# Patient Record
Sex: Female | Born: 1955 | Race: White | Hispanic: No | Marital: Married | Smoking: Never smoker
Health system: Southern US, Community
[De-identification: ages and names within clinical notes are randomized; demographics above are authoritative.]

## PROBLEM LIST (undated history)

## (undated) DIAGNOSIS — C4492 Squamous cell carcinoma of skin, unspecified: Secondary | ICD-10-CM

## (undated) DIAGNOSIS — N951 Menopausal and female climacteric states: Secondary | ICD-10-CM

## (undated) DIAGNOSIS — E785 Hyperlipidemia, unspecified: Secondary | ICD-10-CM

## (undated) DIAGNOSIS — Z8489 Family history of other specified conditions: Secondary | ICD-10-CM

## (undated) HISTORY — PX: LAPAROSCOPIC ABDOMINAL EXPLORATION: SHX6249

## (undated) HISTORY — PX: TUBAL LIGATION: SHX77

## (undated) HISTORY — PX: TONSILLECTOMY: SUR1361

## (undated) HISTORY — PX: COLONOSCOPY: SHX174

## (undated) HISTORY — DX: Squamous cell carcinoma of skin, unspecified: C44.92

---

## 2005-03-06 ENCOUNTER — Ambulatory Visit: Payer: Self-pay | Admitting: Family Medicine

## 2007-12-31 ENCOUNTER — Ambulatory Visit (HOSPITAL_COMMUNITY): Admission: RE | Admit: 2007-12-31 | Discharge: 2007-12-31 | Payer: Self-pay | Admitting: Cardiovascular Disease

## 2010-08-17 HISTORY — PX: OTHER SURGICAL HISTORY: SHX169

## 2013-04-01 ENCOUNTER — Emergency Department (HOSPITAL_COMMUNITY)
Admission: EM | Admit: 2013-04-01 | Discharge: 2013-04-01 | Disposition: A | Discharge status: Home / Self Care | Payer: 59 | Attending: Emergency Medicine | Admitting: Emergency Medicine

## 2013-04-01 ENCOUNTER — Encounter (HOSPITAL_COMMUNITY): Payer: Self-pay | Admitting: Emergency Medicine

## 2013-04-01 ENCOUNTER — Emergency Department (HOSPITAL_COMMUNITY): Payer: 59

## 2013-04-01 DIAGNOSIS — E785 Hyperlipidemia, unspecified: Secondary | ICD-10-CM | POA: Insufficient documentation

## 2013-04-01 DIAGNOSIS — Y9389 Activity, other specified: Secondary | ICD-10-CM | POA: Insufficient documentation

## 2013-04-01 DIAGNOSIS — S52539A Colles' fracture of unspecified radius, initial encounter for closed fracture: Secondary | ICD-10-CM | POA: Insufficient documentation

## 2013-04-01 DIAGNOSIS — Z791 Long term (current) use of non-steroidal anti-inflammatories (NSAID): Secondary | ICD-10-CM | POA: Insufficient documentation

## 2013-04-01 DIAGNOSIS — Z79899 Other long term (current) drug therapy: Secondary | ICD-10-CM | POA: Insufficient documentation

## 2013-04-01 DIAGNOSIS — Z880 Allergy status to penicillin: Secondary | ICD-10-CM | POA: Insufficient documentation

## 2013-04-01 DIAGNOSIS — Z8742 Personal history of other diseases of the female genital tract: Secondary | ICD-10-CM | POA: Insufficient documentation

## 2013-04-01 DIAGNOSIS — Y92410 Unspecified street and highway as the place of occurrence of the external cause: Secondary | ICD-10-CM | POA: Insufficient documentation

## 2013-04-01 HISTORY — DX: Hyperlipidemia, unspecified: E78.5

## 2013-04-01 HISTORY — DX: Menopausal and female climacteric states: N95.1

## 2013-04-01 MED ORDER — METHOCARBAMOL 500 MG PO TABS: 500.0000 mg | ORAL_TABLET | Freq: Four times a day (QID) | ORAL | Status: DC

## 2013-04-01 MED ORDER — SODIUM CHLORIDE 0.9 % IV BOLUS (SEPSIS)
1000.0000 mL | Freq: Once | INTRAVENOUS | Status: AC
  Administered 2013-04-01: 1000 mL via INTRAVENOUS

## 2013-04-01 MED ORDER — OXYCODONE HCL 5 MG PO CAPS: 5.0000 mg | ORAL_CAPSULE | ORAL | Status: DC | PRN

## 2013-04-01 MED ORDER — MORPHINE SULFATE 2 MG/ML IJ SOLN
2.0000 mg | Freq: Once | INTRAMUSCULAR | Status: AC
  Administered 2013-04-01: 2 mg via INTRAVENOUS
  Filled 2013-04-01: qty 1

## 2013-04-01 MED ORDER — OXYCODONE HCL 5 MG PO CAPS: 10.0000 mg | ORAL_CAPSULE | ORAL | Status: DC | PRN

## 2013-04-01 MED ORDER — ONDANSETRON HCL 4 MG/2ML IJ SOLN
4.0000 mg | Freq: Once | INTRAMUSCULAR | Status: AC
  Administered 2013-04-01: 4 mg via INTRAVENOUS
  Filled 2013-04-01: qty 2
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
## 2013-04-01 NOTE — ED Notes (Signed)
Patient transported to X-ray 
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
## 2013-04-01 NOTE — Discharge Instructions (Signed)
Please elevate and keep your upper extremity clean and dry. Please move your fingers frequently and ice the upper extremity. Please call for any problems  If you develop worsening pain in her chest please followup in the emergency room where regular physician.  [name]..Keep bandage clean and dry.  Call for any problems.  No smoking.  Criteria for driving a car: you should be off your pain medicine for 7-8 hours, able to drive one handed(confident), thinking clearly and feeling able in your judgement to drive. Continue elevation as it will decrease swelling.  If instructed by MD move your fingers within the confines of the bandage/splint.  Use ice if instructed by your MD. Call immediately for any sudden loss of feeling in your hand/arm or change in functional abilities of the extremity.  [name]..We recommend that you to take vitamin C 1000 mg a day to promote healing we also recommend that if you require her pain medicine that he take a stool softener to prevent constipation as most pain medicines will have constipation side effects. We recommend either Peri-Colace or Senokot and recommend that you also consider adding MiraLAX to prevent the constipation affects from pain medicine if you are required to use them. These medicines are over the counter and maybe purchased at a local pharmacy.
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
## 2013-04-02 ENCOUNTER — Encounter (HOSPITAL_COMMUNITY): Payer: Self-pay | Admitting: Pharmacy Technician

## 2013-04-02 ENCOUNTER — Other Ambulatory Visit: Payer: Self-pay | Admitting: Orthopedic Surgery

## 2013-04-03 ENCOUNTER — Encounter (HOSPITAL_COMMUNITY): Payer: Self-pay | Admitting: *Deleted

## 2013-04-03 NOTE — ED Provider Notes (Signed)
Medical screening examination/treatment/procedure(s) were performed by non-physician practitioner and as supervising physician I was immediately available for consultation/collaboration.  EKG Interpretation   None         Lauralee Waters T Aleza Pew, MD 04/03/13 0742 

## 2013-04-04 MED ORDER — VANCOMYCIN HCL IN DEXTROSE 1-5 GM/200ML-% IV SOLN
1000.0000 mg | INTRAVENOUS | Status: AC
  Administered 2013-04-05: 1000 mg via INTRAVENOUS
  Filled 2013-04-04: qty 200

## 2013-04-05 ENCOUNTER — Ambulatory Visit (HOSPITAL_COMMUNITY)
Admission: RE | Admit: 2013-04-05 | Discharge: 2013-04-05 | Disposition: A | Discharge status: Home / Self Care | Payer: 59 | Source: Ambulatory Visit | Attending: Orthopedic Surgery | Admitting: Orthopedic Surgery

## 2013-04-05 ENCOUNTER — Encounter (HOSPITAL_COMMUNITY): Payer: 59 | Admitting: Anesthesiology

## 2013-04-05 ENCOUNTER — Ambulatory Visit (HOSPITAL_COMMUNITY): Payer: 59 | Admitting: Anesthesiology

## 2013-04-05 ENCOUNTER — Encounter (HOSPITAL_COMMUNITY): Payer: Self-pay | Admitting: *Deleted

## 2013-04-05 ENCOUNTER — Encounter (HOSPITAL_COMMUNITY)
Admission: RE | Disposition: A | Discharge status: Home / Self Care | Payer: Self-pay | Source: Ambulatory Visit | Attending: Orthopedic Surgery

## 2013-04-05 DIAGNOSIS — X58XXXA Exposure to other specified factors, initial encounter: Secondary | ICD-10-CM | POA: Insufficient documentation

## 2013-04-05 DIAGNOSIS — E785 Hyperlipidemia, unspecified: Secondary | ICD-10-CM | POA: Insufficient documentation

## 2013-04-05 DIAGNOSIS — Z79818 Long term (current) use of other agents affecting estrogen receptors and estrogen levels: Secondary | ICD-10-CM | POA: Insufficient documentation

## 2013-04-05 DIAGNOSIS — Z780 Asymptomatic menopausal state: Secondary | ICD-10-CM | POA: Insufficient documentation

## 2013-04-05 DIAGNOSIS — S52599A Other fractures of lower end of unspecified radius, initial encounter for closed fracture: Secondary | ICD-10-CM | POA: Insufficient documentation

## 2013-04-05 DIAGNOSIS — Z880 Allergy status to penicillin: Secondary | ICD-10-CM | POA: Insufficient documentation

## 2013-04-05 DIAGNOSIS — S5292XA Unspecified fracture of left forearm, initial encounter for closed fracture: Secondary | ICD-10-CM

## 2013-04-05 DIAGNOSIS — Z882 Allergy status to sulfonamides status: Secondary | ICD-10-CM | POA: Insufficient documentation

## 2013-04-05 HISTORY — PX: ORIF RADIAL FRACTURE: SHX5113

## 2013-04-05 HISTORY — DX: Family history of other specified conditions: Z84.89

## 2013-04-05 SURGERY — OPEN REDUCTION INTERNAL FIXATION (ORIF) RADIAL FRACTURE
Anesthesia: General | Site: Arm Upper | Laterality: Left

## 2013-04-05 MED ORDER — LACTATED RINGERS IV SOLN
INTRAVENOUS | Status: DC
  Administered 2013-04-05: 07:00:00 via INTRAVENOUS

## 2013-04-05 MED ORDER — CHLORHEXIDINE GLUCONATE 4 % EX LIQD
60.0000 mL | Freq: Once | CUTANEOUS | Status: DC
  Filled 2013-04-05: qty 60

## 2013-04-05 MED ORDER — MIDAZOLAM HCL 2 MG/2ML IJ SOLN
INTRAMUSCULAR | Status: AC
  Filled 2013-04-05: qty 2

## 2013-04-05 MED ORDER — BUPIVACAINE HCL (PF) 0.25 % IJ SOLN
INTRAMUSCULAR | Status: AC
  Filled 2013-04-05: qty 30

## 2013-04-05 MED ORDER — FENTANYL CITRATE 0.05 MG/ML IJ SOLN
INTRAMUSCULAR | Status: AC
  Filled 2013-04-05: qty 5

## 2013-04-05 MED ORDER — FENTANYL CITRATE 0.05 MG/ML IJ SOLN
INTRAMUSCULAR | Status: DC | PRN
  Administered 2013-04-05: 100 ug via INTRAVENOUS
  Administered 2013-04-05: 50 ug via INTRAVENOUS

## 2013-04-05 MED ORDER — SODIUM CHLORIDE 0.45 % IV SOLN: INTRAVENOUS | Status: DC

## 2013-04-05 MED ORDER — MIDAZOLAM HCL 5 MG/5ML IJ SOLN
INTRAMUSCULAR | Status: DC | PRN
  Administered 2013-04-05 (×2): 1 mg via INTRAVENOUS
  Administered 2013-04-05: 2 mg via INTRAVENOUS

## 2013-04-05 MED ORDER — DOXYCYCLINE HYCLATE 50 MG PO CAPS: 50.0000 mg | ORAL_CAPSULE | Freq: Two times a day (BID) | ORAL | Status: DC

## 2013-04-05 MED ORDER — LACTATED RINGERS IV SOLN
INTRAVENOUS | Status: DC | PRN
  Administered 2013-04-05: 08:00:00 via INTRAVENOUS

## 2013-04-05 MED ORDER — 0.9 % SODIUM CHLORIDE (POUR BTL) OPTIME
TOPICAL | Status: DC | PRN
  Administered 2013-04-05: 1000 mL

## 2013-04-05 MED ORDER — FENTANYL CITRATE 0.05 MG/ML IJ SOLN
INTRAMUSCULAR | Status: AC
  Filled 2013-04-05: qty 2

## 2013-04-05 MED ORDER — ONDANSETRON HCL 4 MG/2ML IJ SOLN
INTRAMUSCULAR | Status: DC | PRN
  Administered 2013-04-05: 4 mg via INTRAVENOUS

## 2013-04-05 SURGICAL SUPPLY — 50 items
BANDAGE ELASTIC 3 VELCRO ST LF (GAUZE/BANDAGES/DRESSINGS) ×3 IMPLANT
BANDAGE ELASTIC 4 VELCRO ST LF (GAUZE/BANDAGES/DRESSINGS) ×3 IMPLANT
BANDAGE GAUZE ELAST BULKY 4 IN (GAUZE/BANDAGES/DRESSINGS) ×3 IMPLANT
BLADE SURG ROTATE 9660 (MISCELLANEOUS) IMPLANT
BNDG ESMARK 4X9 LF (GAUZE/BANDAGES/DRESSINGS) ×3 IMPLANT
CORDS BIPOLAR (ELECTRODE) ×3 IMPLANT
COVER SURGICAL LIGHT HANDLE (MISCELLANEOUS) ×3 IMPLANT
CUFF TOURNIQUET SINGLE 18IN (TOURNIQUET CUFF) ×3 IMPLANT
CUFF TOURNIQUET SINGLE 24IN (TOURNIQUET CUFF) IMPLANT
DECANTER SPIKE VIAL GLASS SM (MISCELLANEOUS) IMPLANT
DRAIN TLS ROUND 10FR (DRAIN) IMPLANT
DRAPE OEC MINIVIEW 54X84 (DRAPES) ×3 IMPLANT
DRAPE U-SHAPE 47X51 STRL (DRAPES) ×3 IMPLANT
DRSG EMULSION OIL 3X3 NADH (GAUZE/BANDAGES/DRESSINGS) ×3 IMPLANT
GAUZE XEROFORM 1X8 LF (GAUZE/BANDAGES/DRESSINGS) ×3 IMPLANT
GLOVE ORTHO TXT STRL SZ7.5 (GLOVE) ×3 IMPLANT
GLOVE SS BIOGEL STRL SZ 8 (GLOVE) ×1 IMPLANT
GLOVE SUPERSENSE BIOGEL SZ 8 (GLOVE) ×2
GOWN SRG XL XLNG 56XLVL 4 (GOWN DISPOSABLE) ×3 IMPLANT
GOWN STRL NON-REIN LRG LVL3 (GOWN DISPOSABLE) ×9 IMPLANT
GOWN STRL NON-REIN XL XLG LVL4 (GOWN DISPOSABLE) ×6
KIT BASIN OR (CUSTOM PROCEDURE TRAY) ×3 IMPLANT
KIT ROOM TURNOVER OR (KITS) ×3 IMPLANT
LOOP VESSEL MAXI BLUE (MISCELLANEOUS) IMPLANT
MANIFOLD NEPTUNE II (INSTRUMENTS) IMPLANT
NEEDLE 22X1 1/2 (OR ONLY) (NEEDLE) ×3 IMPLANT
NS IRRIG 1000ML POUR BTL (IV SOLUTION) ×3 IMPLANT
PACK ORTHO EXTREMITY (CUSTOM PROCEDURE TRAY) ×3 IMPLANT
PAD ARMBOARD 7.5X6 YLW CONV (MISCELLANEOUS) ×6 IMPLANT
PAD CAST 4YDX4 CTTN HI CHSV (CAST SUPPLIES) ×1 IMPLANT
PADDING CAST COTTON 4X4 STRL (CAST SUPPLIES) ×2
PLATE CROSSLOCK DVR EPACK (Plate) ×3 IMPLANT
SCREW LOCKING 2.7MM 12MM STE (Screw) ×3 IMPLANT
SPLINT FIBERGLASS 3X35 (CAST SUPPLIES) ×3 IMPLANT
SPONGE GAUZE 4X4 12PLY (GAUZE/BANDAGES/DRESSINGS) ×3 IMPLANT
SPONGE GAUZE 4X4 12PLY STER LF (GAUZE/BANDAGES/DRESSINGS) ×3 IMPLANT
SPONGE LAP 4X18 X RAY DECT (DISPOSABLE) IMPLANT
SUT MNCRL AB 4-0 PS2 18 (SUTURE) ×3 IMPLANT
SUT PROLENE 3 0 PS 2 (SUTURE) IMPLANT
SUT PROLENE 4 0 PS 2 18 (SUTURE) ×6 IMPLANT
SUT VIC AB 3-0 FS2 27 (SUTURE) ×6 IMPLANT
SYR CONTROL 10ML LL (SYRINGE) ×3 IMPLANT
SYSTEM CHEST DRAIN TLS 7FR (DRAIN) IMPLANT
TOWEL OR 17X24 6PK STRL BLUE (TOWEL DISPOSABLE) ×3 IMPLANT
TOWEL OR 17X26 10 PK STRL BLUE (TOWEL DISPOSABLE) ×3 IMPLANT
TUBE CONNECTING 12'X1/4 (SUCTIONS) ×1
TUBE CONNECTING 12X1/4 (SUCTIONS) ×2 IMPLANT
TUBE EVACUATION TLS (MISCELLANEOUS) ×3 IMPLANT
UNDERPAD 30X30 INCONTINENT (UNDERPADS AND DIAPERS) ×3 IMPLANT
WATER STERILE IRR 1000ML POUR (IV SOLUTION) IMPLANT

## 2013-04-05 NOTE — Anesthesia Preprocedure Evaluation (Addendum)
Anesthesia Evaluation  Patient identified by MRN, date of birth, ID band Patient awake    Reviewed: Allergy & Precautions, H&P , NPO status , Patient's Chart, lab work & pertinent test results  Airway Mallampati: II TM Distance: >3 FB     Dental  (+) Teeth Intact   Pulmonary    Pulmonary exam normal       Cardiovascular Rhythm:Regular Rate:Normal     Neuro/Psych    GI/Hepatic   Endo/Other    Renal/GU      Musculoskeletal   Abdominal Normal abdominal exam  (+)   Peds  Hematology   Anesthesia Other Findings   Reproductive/Obstetrics                          Anesthesia Physical Anesthesia Plan  ASA: I  Anesthesia Plan: MAC and Regional   Post-op Pain Management: MAC Combined w/ Regional for Post-op pain   Induction:   Airway Management Planned: Nasal Cannula  Additional Equipment:   Intra-op Plan:   Post-operative Plan:   Informed Consent:   Plan Discussed with:   Anesthesia Plan Comments:         Anesthesia Quick Evaluation

## 2013-04-05 NOTE — Transfer of Care (Signed)
Immediate Anesthesia Transfer of Care Note  Patient: Jennifer Pitts  Procedure(s) Performed: Procedure(s): OPEN REDUCTION INTERNAL FIXATION (ORIF) LEFT RADIUS FRACTURE WITH REPAIR RECONSTRUCTION AS NECESSARY  (Left)  Patient Location: PACU  Anesthesia Type:MAC combined with regional for post-op pain  Level of Consciousness: awake, alert  and oriented  Airway & Oxygen Therapy: Patient Spontanous Breathing and Patient connected to nasal cannula oxygen  Post-op Assessment: Report given to PACU RN and Post -op Vital signs reviewed and stable  Post vital signs: Reviewed and stable  Complications: No apparent anesthesia complications

## 2013-04-05 NOTE — Op Note (Signed)
NAMErskine Squibb:  Jennifer Pitts, Jennifer Pitts             ACCOUNT NO.:  [id number]  MEDICAL RECORD NO.:  [id number]  LOCATION:  [facility]                         FACILITY:  [facility]  PHYSICIAN:  Dionne AnoWilliam M. Avry Roedl, M.D.DATE OF BIRTH:  [birth year]  DATE OF PROCEDURE:  04/05/2013 DATE OF DISCHARGE:                              OPERATIVE REPORT   PREOPERATIVE DIAGNOSIS:  Comminuted complex greater than 3-part intra- articular distal radius fracture, left upper extremity.  POSTOPERATIVE DIAGNOSIS:  Comminuted complex greater than 3-part intra- articular distal radius fracture, left upper extremity.  PROCEDURE: 1. Left wrist open reduction and internal fixation with [employer] plate     and screw construct. 2. Stress radiography. 3. Sliding brachioradialis tenotomy.  SURGEON:  Dionne AnoWilliam M. Amanda PeaGramig, M.D.  ASSISTANT:  None.  COMPLICATIONS:  None.  ANESTHESIA:  Block anesthetic with IV sedation.  TOURNIQUET TIME:  Less than an hour.  ESTIMATED BLOOD LOSS:  Minimal.  INDICATION:  This pleasant female presents with comminuted complex greater than 3-part intra-articular distal radius fracture.  She understands risks and benefits of surgery and desires to proceed with the above-mentioned operative intervention.  I have discussed with her all issues, do's and don'ts, and all questions have been encouraged and answered.  With this in mind, we will proceed accordingly.  Our goal was to obtain adequate geometry and reconciliation of her anatomy so as to provide her the most optimal function for the future.  OPERATION IN DETAIL:  The patient seen by myself and anesthesia.  Dr. Kipp Broodavid Joslin performed anesthetic services for block anesthetic, this worked wonderfully.  She had no pain, was taken to the operative arena. Arm had been marked.  Time-out was called.  I prepped and draped her myself with Betadine scrub and paint.  Following this, secured a sterile field.  Arm was elevated, tourniquet was insufflated to 250 mmHg  and a volar radial incision was made after time-out was called.  The volar radial incision allowed adequate access to the FCR tendon, which was split.  Compartment volarly was split nicely without complicating features.  The carpal canal contents were retracted ulnarly.  Pronator incised in a L-shape fashion, and the fracture was accessed.  Following this, I then performed fracture site reduction manually and then applied a narrow DVR plate from [employer].  This was in the form of a plate and screw construct allowing combination smooth pegs and screws for fixation.  The patient tolerated this quite well without difficulty and all screw lengths looked wonderful at the conclusion of the case.  I was able to achieve adequate radial height, volar tilt, and radial inclination.  A sliding brachioradialis tenotomy was accomplished without difficulty to lessen deforming forces off of the styloid.  Following this, stress radiography revealed excellent position.  Her ulnar styloid was stable.  TFC appeared to be stable.  Intercarpal arrangements stable.  I did not see any obvious interosseous ligament injury.  Live fluoro was accomplished to verify this.  Following this, the patient had copious irrigation applied followed by closure of the pronator with 3-0 Vicryl followed by closure of the skin edge with Prolene at the conclusion of the case with hemostasis secured.  The patient tolerated this well.  There  were no complicating features.  The patient had a bandage of Adaptic, Xeroform, gauze, and a clamshell splint applied without difficulty.  She was taken to the recovery room in stable condition after waking.  We will be monitoring her in the recovery room and then discharge her home.  We will plan to discharge her home with doxycycline for antibiotic prophylaxis as well as her regular pain medicine, which she has and regular medicines in general. We will see her in 12 days.  We will cast her at  4 weeks, begin interval range of motion.  Between 4 and 8 weeks, motion will be the rule after 8 weeks strengthening.  These notes have been discussed and all questions have been encouraged and answered.  It was an absolute pleasure to see and treat her today.     Dionne Ano. Amanda Pea, M.D.     [facility]  D:  04/05/2013  T:  04/05/2013  Job:  [id number]

## 2013-04-05 NOTE — Op Note (Signed)
See Dictation #[id number] Amanda PeaGramig MD
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
## 2013-04-05 NOTE — Discharge Instructions (Signed)

## 2013-04-05 NOTE — Anesthesia Postprocedure Evaluation (Signed)
  Anesthesia Post-op Note  Patient: Jennifer Pitts  Procedure(s) Performed: Procedure(s): OPEN REDUCTION INTERNAL FIXATION (ORIF) LEFT RADIUS FRACTURE WITH REPAIR RECONSTRUCTION AS NECESSARY  (Left)  Patient Location: PACU  Anesthesia Type:MAC combined with regional for post-op pain  Level of Consciousness: awake, alert  and oriented  Airway and Oxygen Therapy: Patient Spontanous Breathing  Post-op Pain: none  Post-op Assessment: Post-op Vital signs reviewed, Patient's Cardiovascular Status Stable, Respiratory Function Stable, Patent Airway and Pain level controlled  Post-op Vital Signs: stable  Complications: No apparent anesthesia complications

## 2013-04-05 NOTE — Anesthesia Procedure Notes (Signed)
Anesthesia Regional Block:  Supraclavicular block  Pre-Anesthetic Checklist: ,, timeout performed, Correct Patient, Correct Site, Correct Laterality, Correct Procedure, Correct Position, site marked, Risks and benefits discussed,  Surgical consent,  Pre-op evaluation,  At surgeon's request and post-op pain management  Laterality: Left  Prep: chloraprep       Needles:  Injection technique: Single-shot  Needle Type: Echogenic Stimulator Needle     Needle Length: 9cm 9 cm Needle Gauge: 22 and 22 G    Additional Needles:  Procedures: ultrasound guided (picture in chart) Supraclavicular block Narrative:  Start time: 04/05/2013 10:00 AM End time: 04/05/2013 10:05 AM Injection made incrementally with aspirations every 5 mL.  Performed by: Personally   Additional Notes: 25 cc 0.5% marcaine with 1:200 epi and 8 mg decadron injected easily

## 2013-04-05 NOTE — Preoperative (Signed)
Beta Blockers   Reason not to administer Beta Blockers:Not Applicable 

## 2013-04-09 ENCOUNTER — Encounter (HOSPITAL_COMMUNITY): Payer: Self-pay | Admitting: Orthopedic Surgery

## 2013-11-19 ENCOUNTER — Other Ambulatory Visit: Payer: Self-pay | Admitting: Family Medicine

## 2013-11-19 DIAGNOSIS — R945 Abnormal results of liver function studies: Principal | ICD-10-CM

## 2013-11-19 DIAGNOSIS — R7989 Other specified abnormal findings of blood chemistry: Secondary | ICD-10-CM

## 2013-12-01 ENCOUNTER — Ambulatory Visit
Admission: RE | Admit: 2013-12-01 | Discharge: 2013-12-01 | Disposition: A | Discharge status: Home / Self Care | Payer: 59 | Source: Ambulatory Visit | Attending: Family Medicine | Admitting: Family Medicine

## 2013-12-01 DIAGNOSIS — R7989 Other specified abnormal findings of blood chemistry: Secondary | ICD-10-CM

## 2013-12-01 DIAGNOSIS — R945 Abnormal results of liver function studies: Principal | ICD-10-CM

## 2014-08-11 ENCOUNTER — Encounter: Payer: Self-pay | Admitting: *Deleted

## 2015-02-15 MED FILL — valACYclovir HCL 1 GM TABS: 1 | 30 days supply | Qty: 20 | Fill #1

## 2015-02-15 MED FILL — LEVOTHYROXINE 50 MCG TABLET: 50 | 30 days supply | Qty: 30 | Fill #1

## 2015-02-23 DIAGNOSIS — H4720 Unspecified optic atrophy: Secondary | ICD-10-CM | POA: Diagnosis not present

## 2015-02-23 DIAGNOSIS — H524 Presbyopia: Secondary | ICD-10-CM | POA: Diagnosis not present

## 2015-02-25 MED FILL — WELCHOL 625 MG TABLET: 625 | 30 days supply | Qty: 180 | Fill #1

## 2015-03-10 MED FILL — LEVOTHYROXINE 50 MCG TABLET: 50 | 30 days supply | Qty: 30 | Fill #2

## 2015-04-01 DIAGNOSIS — E782 Mixed hyperlipidemia: Secondary | ICD-10-CM | POA: Diagnosis not present

## 2015-04-01 DIAGNOSIS — E038 Other specified hypothyroidism: Secondary | ICD-10-CM | POA: Diagnosis not present

## 2015-04-13 MED FILL — WELCHOL 625 MG TABLET: 625 | 30 days supply | Qty: 180 | Fill #2

## 2015-04-13 MED FILL — LEVOTHYROXINE 50 MCG TABLET: 50 | 30 days supply | Qty: 30 | Fill #0

## 2015-04-16 DIAGNOSIS — K760 Fatty (change of) liver, not elsewhere classified: Secondary | ICD-10-CM | POA: Diagnosis not present

## 2015-04-16 DIAGNOSIS — R740 Nonspecific elevation of levels of transaminase and lactic acid dehydrogenase [LDH]: Secondary | ICD-10-CM | POA: Diagnosis not present

## 2015-05-20 MED FILL — LEVOTHYROXINE 50 MCG TABLET: 50 | 30 days supply | Qty: 30 | Fill #1

## 2015-05-20 MED FILL — WELCHOL 625 MG TABLET: 625 | 30 days supply | Qty: 180 | Fill #3

## 2015-06-07 DIAGNOSIS — W57XXXA Bitten or stung by nonvenomous insect and other nonvenomous arthropods, initial encounter: Secondary | ICD-10-CM | POA: Diagnosis not present

## 2015-06-07 DIAGNOSIS — S70361A Insect bite (nonvenomous), right thigh, initial encounter: Secondary | ICD-10-CM | POA: Diagnosis not present

## 2015-06-07 MED FILL — MUPIROCIN 2% OINTMENT: 2 | 5 days supply | Qty: 22 | Fill #0
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
## 2015-06-22 MED FILL — LEVOTHYROXINE 50 MCG TABLET: 50 | 30 days supply | Qty: 30 | Fill #2

## 2015-07-22 MED FILL — WELCHOL 625 MG TABLET: 625 | 30 days supply | Qty: 180 | Fill #4

## 2015-07-23 DIAGNOSIS — R740 Nonspecific elevation of levels of transaminase and lactic acid dehydrogenase [LDH]: Secondary | ICD-10-CM | POA: Diagnosis not present

## 2015-08-02 MED FILL — LEVOTHYROXINE 50 MCG TABLET: 50 | 30 days supply | Qty: 30 | Fill #3

## 2015-08-20 DIAGNOSIS — Z780 Asymptomatic menopausal state: Secondary | ICD-10-CM | POA: Diagnosis not present

## 2015-08-20 DIAGNOSIS — Z13820 Encounter for screening for osteoporosis: Secondary | ICD-10-CM | POA: Diagnosis not present

## 2015-09-07 MED FILL — LEVOTHYROXINE 50 MCG TABLET: 50 | 30 days supply | Qty: 30 | Fill #4

## 2015-09-14 DIAGNOSIS — M8580 Other specified disorders of bone density and structure, unspecified site: Secondary | ICD-10-CM | POA: Diagnosis not present

## 2015-09-14 MED FILL — ALENDRONATE NA 70 MG TAB: 70 | 28 days supply | Qty: 4 | Fill #0

## 2015-09-23 DIAGNOSIS — R740 Nonspecific elevation of levels of transaminase and lactic acid dehydrogenase [LDH]: Secondary | ICD-10-CM | POA: Diagnosis not present

## 2015-09-28 DIAGNOSIS — R740 Nonspecific elevation of levels of transaminase and lactic acid dehydrogenase [LDH]: Secondary | ICD-10-CM | POA: Diagnosis not present

## 2015-10-06 MED FILL — LEVOTHYROXINE 50 MCG TABLET: 50 | 30 days supply | Qty: 30 | Fill #5

## 2015-10-14 MED FILL — ALENDRONATE NA 70 MG TAB: 70 | 28 days supply | Qty: 4 | Fill #1

## 2015-11-08 MED FILL — LEVOTHYROXINE 50 MCG TABLET: 50 | 30 days supply | Qty: 30 | Fill #6

## 2015-11-08 MED FILL — ALENDRONATE NA 70 MG TAB: 70 | 28 days supply | Qty: 4 | Fill #2

## 2015-12-03 MED FILL — WELCHOL 625 MG TABLET: 625 | 30 days supply | Qty: 180 | Fill #5

## 2015-12-13 MED FILL — LEVOTHYROXINE 50 MCG TABLET: 50 | 30 days supply | Qty: 30 | Fill #7

## 2015-12-13 MED FILL — ALENDRONATE NA 70 MG TAB: 70 | 28 days supply | Qty: 4 | Fill #3

## 2015-12-16 DIAGNOSIS — R740 Nonspecific elevation of levels of transaminase and lactic acid dehydrogenase [LDH]: Secondary | ICD-10-CM | POA: Diagnosis not present

## 2016-01-20 MED FILL — LEVOTHYROXINE 50 MCG TABLET: 50 | 30 days supply | Qty: 30 | Fill #8

## 2016-01-25 MED FILL — ALENDRONATE NA 70 MG TAB: 70 | 28 days supply | Qty: 4 | Fill #0

## 2016-02-20 IMAGING — US US ABDOMEN LIMITED
1 series · 14 of 25 positions shown · non-contrast
Comparison: None.

CLINICAL DATA: Elevated liver enzymes

EXAM:
US ABDOMEN LIMITED - RIGHT UPPER QUADRANT

[Series 1: us abdomen limited · 0.28mm/px · 14 of 49 slices shown]
[im 1/49]
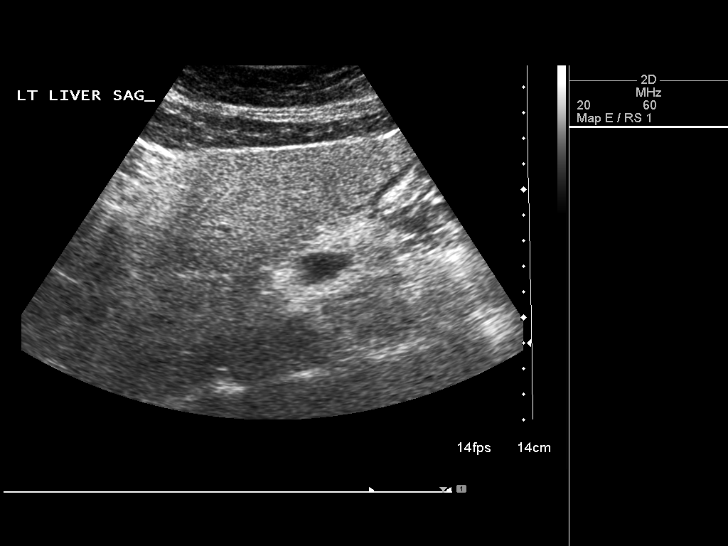
[im 5/49]
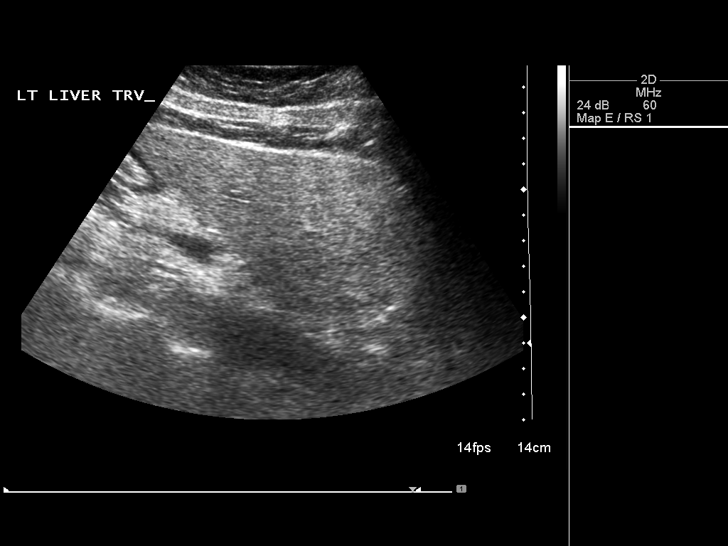
[im 9/49]
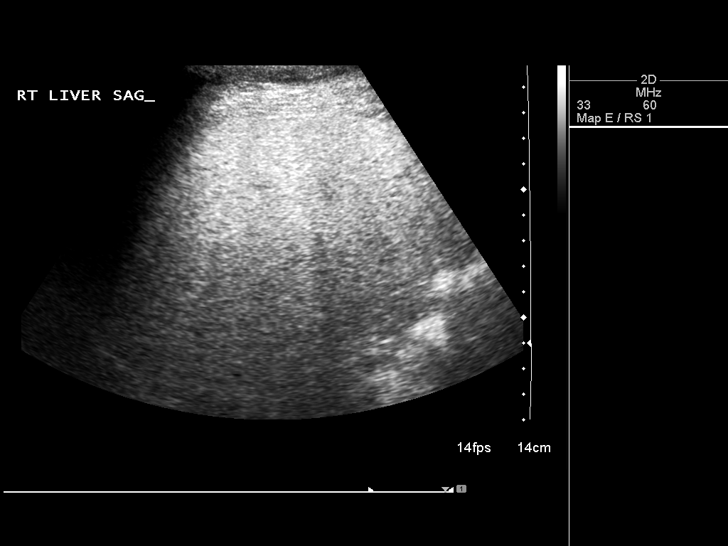
[im 13/49]
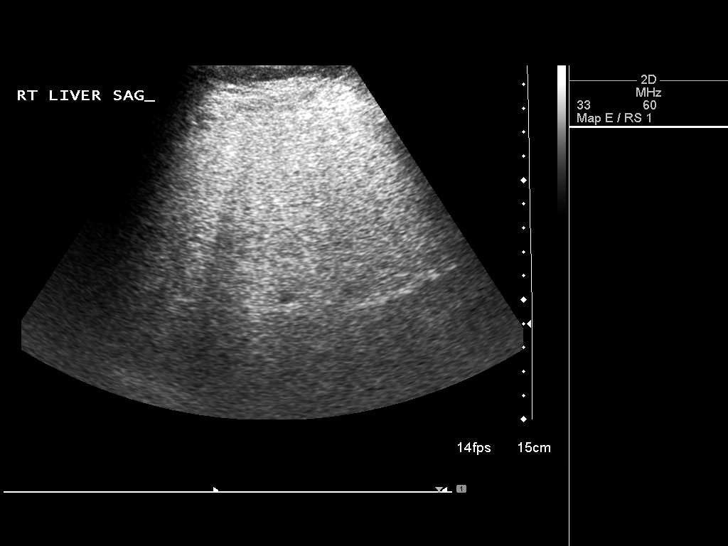
[im 17/49]
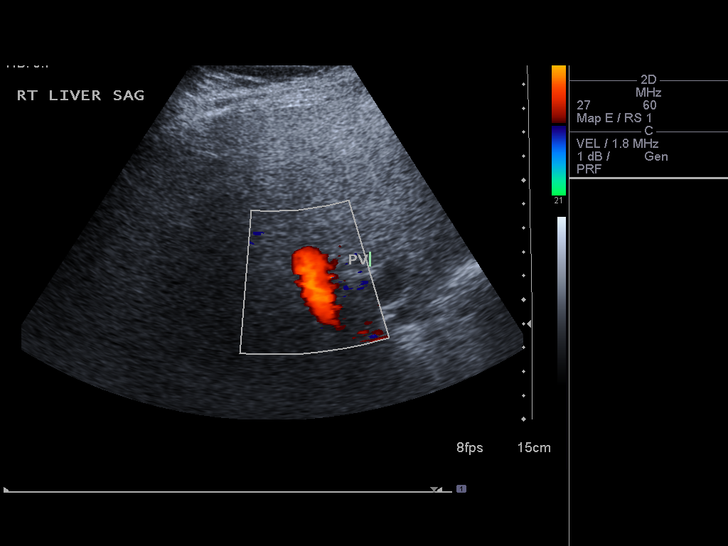
[im 19/49]
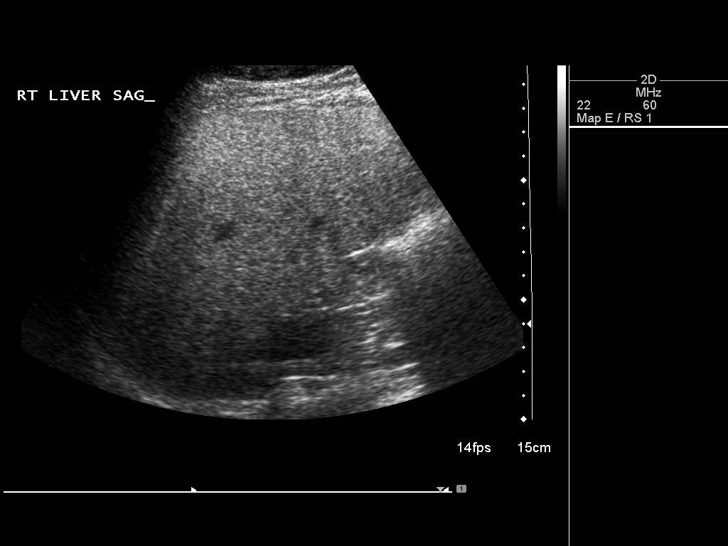
[im 23/49]
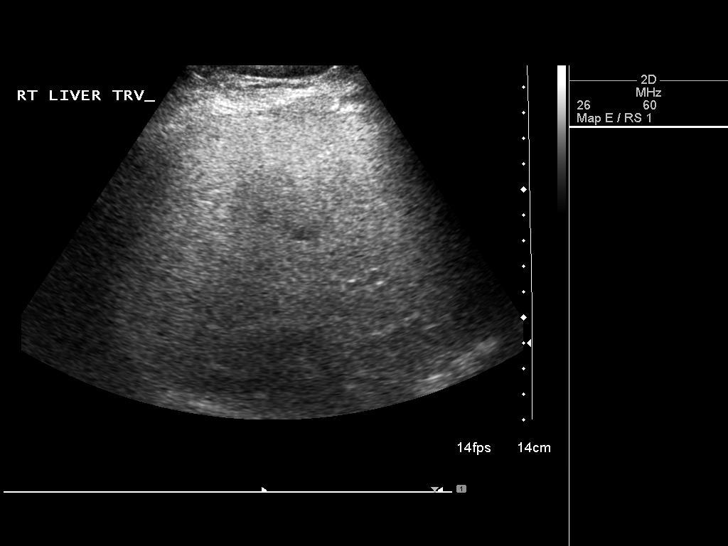
[im 27/49]
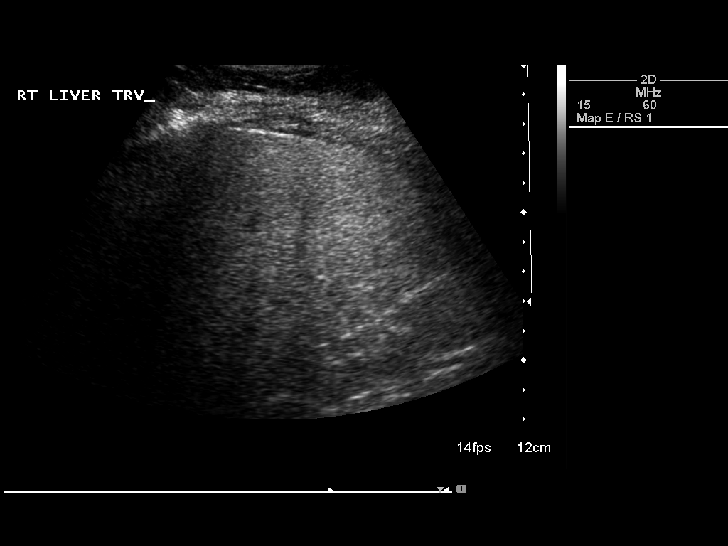
[im 31/49]
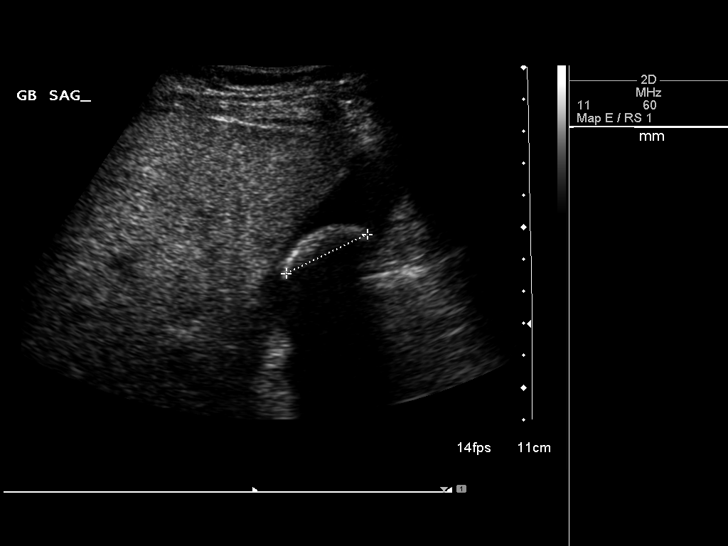
[im 33/49]
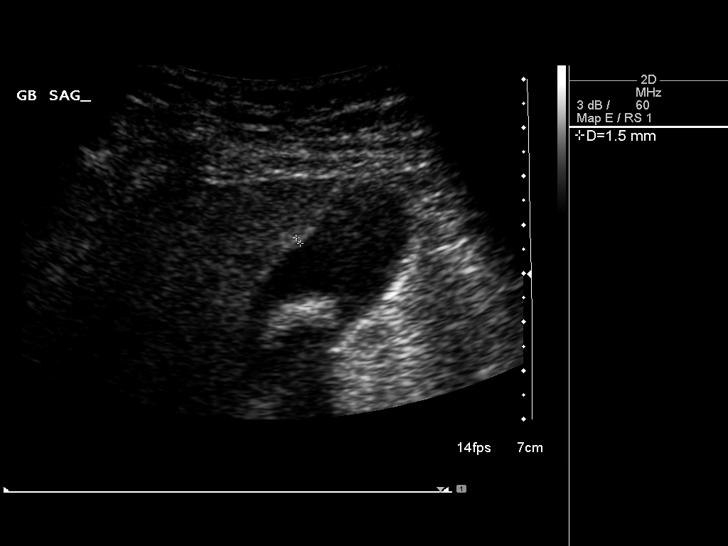
[im 37/49]
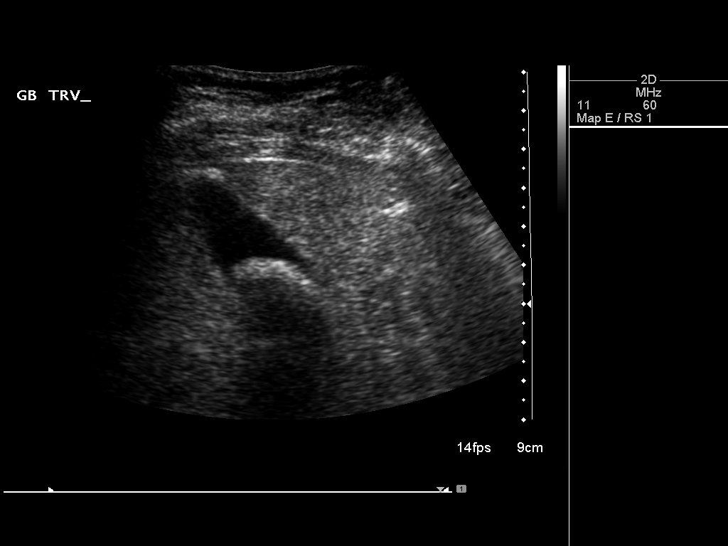
[im 41/49]
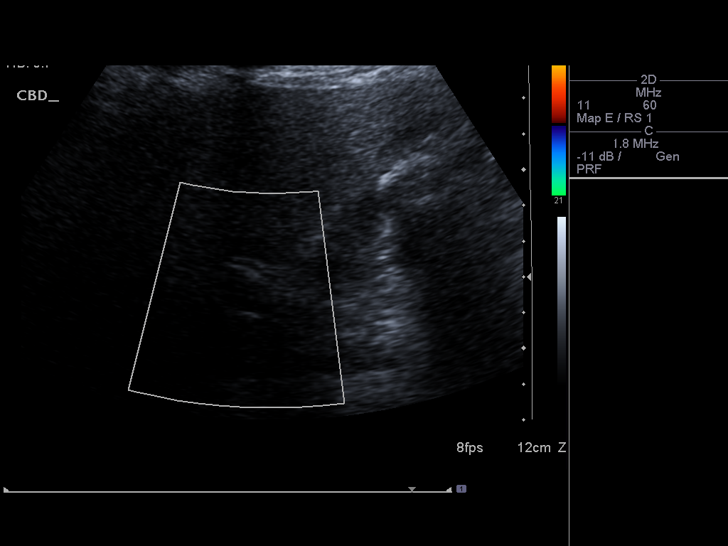
[im 45/49]
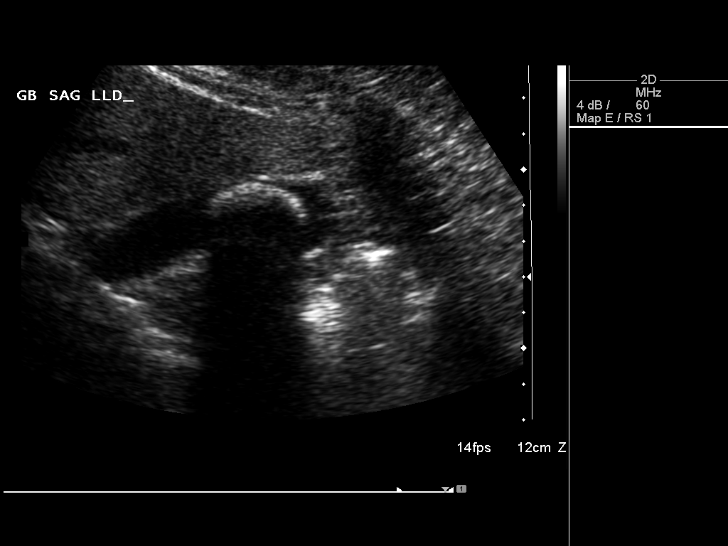
[im 49/49]
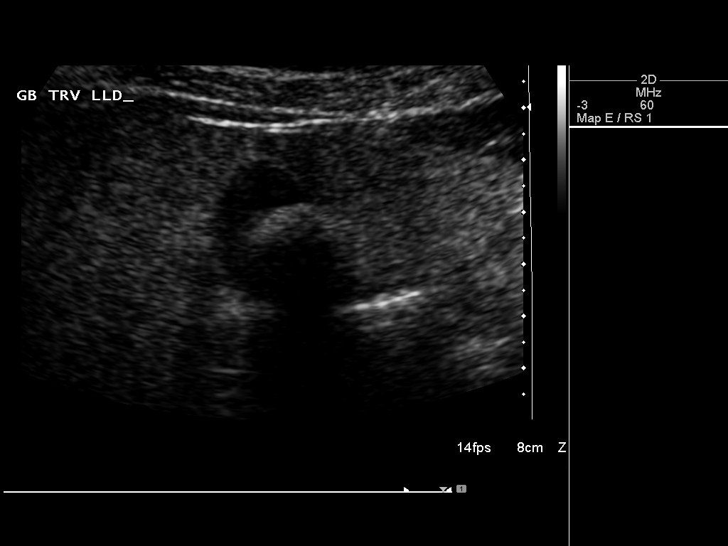

[14 of 25 positions shown; findings below may reference images not displayed]

FINDINGS: Gallbladder:

Within the gallbladder there is a 2.8 cm echogenic focus which moves
and shadows consistent with either 1 large or multiple smaller
adherent gallstones. There is no gallbladder wall thickening or
pericholecystic fluid. No sonographic Murphy sign noted.

Common bile duct:

Diameter: 3 mm. There is no intrahepatic or extrahepatic biliary
duct dilatation.

Liver:

No focal lesion identified. Liver echogenicity is diffusely
increased.
IMPRESSION: Cholelithiasis.

Diffuse increased liver echogenicity, most likely indicative of
hepatic steatosis. While no focal liver lesions are identified, it
must be cautioned that the sensitivity of ultrasound for focal liver
lesions is diminished in this circumstance.

## 2016-02-25 MED FILL — LEVOTHYROXINE 50 MCG TABLET: 50 | 30 days supply | Qty: 30 | Fill #9

## 2016-03-13 MED FILL — ALENDRONATE NA 70 MG TAB: 70 | 28 days supply | Qty: 4 | Fill #1

## 2016-03-29 MED FILL — LEVOTHYROXINE 50 MCG TABLET: 50 | 30 days supply | Qty: 30 | Fill #10

## 2016-04-13 MED FILL — ALENDRONATE NA 70 MG TAB: 70 | 28 days supply | Qty: 4 | Fill #2

## 2016-04-14 DIAGNOSIS — R7989 Other specified abnormal findings of blood chemistry: Secondary | ICD-10-CM | POA: Diagnosis not present

## 2016-04-14 DIAGNOSIS — E038 Other specified hypothyroidism: Secondary | ICD-10-CM | POA: Diagnosis not present

## 2016-04-14 DIAGNOSIS — M8580 Other specified disorders of bone density and structure, unspecified site: Secondary | ICD-10-CM | POA: Diagnosis not present

## 2016-04-27 MED FILL — LEVOTHYROXINE 50 MCG TABLET: 50 | 30 days supply | Qty: 30 | Fill #0

## 2016-04-28 DIAGNOSIS — L309 Dermatitis, unspecified: Secondary | ICD-10-CM | POA: Diagnosis not present

## 2016-04-28 DIAGNOSIS — R300 Dysuria: Secondary | ICD-10-CM | POA: Diagnosis not present

## 2016-04-28 DIAGNOSIS — N390 Urinary tract infection, site not specified: Secondary | ICD-10-CM | POA: Diagnosis not present

## 2016-04-28 MED FILL — MOMETASONE FUROATE 0.1% CRM: 0.1 | 5 days supply | Qty: 15 | Fill #0

## 2016-05-29 MED FILL — LEVOTHYROXINE 50 MCG TABLET: 50 | 30 days supply | Qty: 30 | Fill #1

## 2016-05-31 MED FILL — ALENDRONATE NA 70 MG TAB: 70 | 28 days supply | Qty: 4 | Fill #3

## 2016-07-04 MED FILL — LEVOTHYROXINE 50 MCG TABLET: 50 | 30 days supply | Qty: 30 | Fill #2

## 2016-07-04 MED FILL — ALENDRONATE NA 70 MG TAB: 70 | 28 days supply | Qty: 4 | Fill #0

## 2016-07-05 DIAGNOSIS — H4720 Unspecified optic atrophy: Secondary | ICD-10-CM | POA: Diagnosis not present

## 2016-07-05 DIAGNOSIS — H5213 Myopia, bilateral: Secondary | ICD-10-CM | POA: Diagnosis not present

## 2016-07-07 DIAGNOSIS — R7989 Other specified abnormal findings of blood chemistry: Secondary | ICD-10-CM | POA: Diagnosis not present

## 2016-07-07 DIAGNOSIS — E038 Other specified hypothyroidism: Secondary | ICD-10-CM | POA: Diagnosis not present

## 2016-07-07 DIAGNOSIS — W57XXXA Bitten or stung by nonvenomous insect and other nonvenomous arthropods, initial encounter: Secondary | ICD-10-CM | POA: Diagnosis not present

## 2016-07-07 DIAGNOSIS — E782 Mixed hyperlipidemia: Secondary | ICD-10-CM | POA: Diagnosis not present

## 2016-07-31 DIAGNOSIS — N3000 Acute cystitis without hematuria: Secondary | ICD-10-CM | POA: Diagnosis not present

## 2016-08-03 MED FILL — ALENDRONATE NA 70 MG TAB: 70 | 28 days supply | Qty: 4 | Fill #1

## 2016-08-03 MED FILL — LEVOTHYROXINE 50 MCG TABLET: 50 | 30 days supply | Qty: 30 | Fill #3

## 2016-09-04 MED FILL — ALENDRONATE NA 70 MG TAB: 70 | 28 days supply | Qty: 4 | Fill #2

## 2016-09-04 MED FILL — LEVOTHYROXINE 50 MCG TABLET: 50 | 30 days supply | Qty: 30 | Fill #4

## 2016-09-14 DIAGNOSIS — Z0000 Encounter for general adult medical examination without abnormal findings: Secondary | ICD-10-CM | POA: Diagnosis not present

## 2016-09-14 DIAGNOSIS — Z124 Encounter for screening for malignant neoplasm of cervix: Secondary | ICD-10-CM | POA: Diagnosis not present

## 2016-10-02 DIAGNOSIS — R740 Nonspecific elevation of levels of transaminase and lactic acid dehydrogenase [LDH]: Secondary | ICD-10-CM | POA: Diagnosis not present

## 2016-10-04 MED FILL — ALENDRONATE NA 70 MG TAB: 70 | 28 days supply | Qty: 4 | Fill #3

## 2016-10-04 MED FILL — LEVOTHYROXINE 50 MCG TABLET: 50 | 30 days supply | Qty: 30 | Fill #5

## 2016-11-13 MED FILL — LEVOTHYROXINE 50 MCG TABLET: 50 | 30 days supply | Qty: 30 | Fill #0

## 2016-11-13 MED FILL — ALENDRONATE NA 70 MG TAB: 70 | 28 days supply | Qty: 4 | Fill #4

## 2016-11-14 DIAGNOSIS — M8589 Other specified disorders of bone density and structure, multiple sites: Secondary | ICD-10-CM | POA: Diagnosis not present

## 2016-11-14 DIAGNOSIS — Z780 Asymptomatic menopausal state: Secondary | ICD-10-CM | POA: Diagnosis not present

## 2016-11-14 DIAGNOSIS — Z1231 Encounter for screening mammogram for malignant neoplasm of breast: Secondary | ICD-10-CM | POA: Diagnosis not present

## 2016-11-15 MED FILL — MOMETASONE FUROATE 0.1% CRM: 0.1 | 5 days supply | Qty: 15 | Fill #0

## 2016-12-12 DIAGNOSIS — R740 Nonspecific elevation of levels of transaminase and lactic acid dehydrogenase [LDH]: Secondary | ICD-10-CM | POA: Diagnosis not present

## 2016-12-14 MED FILL — LEVOTHYROXINE 50 MCG TABLET: 50 | 30 days supply | Qty: 30 | Fill #1

## 2016-12-14 MED FILL — ALENDRONATE NA 70 MG TAB: 70 | 28 days supply | Qty: 4 | Fill #5

## 2017-01-17 MED FILL — LEVOTHYROXINE 50 MCG TABLET: 50 | 30 days supply | Qty: 30 | Fill #2

## 2017-02-19 MED FILL — ALENDRONATE NA 70 MG TAB: 70 | 28 days supply | Qty: 4 | Fill #0

## 2017-02-19 MED FILL — LEVOTHYROXINE 50 MCG TABLET: 50 | 30 days supply | Qty: 30 | Fill #3

## 2017-03-01 DIAGNOSIS — L309 Dermatitis, unspecified: Secondary | ICD-10-CM | POA: Diagnosis not present

## 2017-03-12 DIAGNOSIS — L710 Perioral dermatitis: Secondary | ICD-10-CM | POA: Diagnosis not present

## 2017-03-13 MED FILL — DOXYCYCLINE HYCLATE 100 MG: 100 | 30 days supply | Qty: 30 | Fill #0

## 2017-03-13 MED FILL — NUTRASEB CREA: 30 days supply | Qty: 100 | Fill #0

## 2017-03-13 MED FILL — metroNIDAZOLE 0.75 % CREA: 0.75 | 30 days supply | Qty: 45 | Fill #0

## 2017-03-19 MED FILL — LEVOTHYROXINE 50 MCG TABLET: 50 | 30 days supply | Qty: 30 | Fill #0

## 2017-04-03 MED FILL — ALENDRONATE NA 70 MG TAB: 70 | 28 days supply | Qty: 4 | Fill #1

## 2017-04-04 DIAGNOSIS — M79601 Pain in right arm: Secondary | ICD-10-CM | POA: Diagnosis not present

## 2017-04-04 DIAGNOSIS — M7541 Impingement syndrome of right shoulder: Secondary | ICD-10-CM | POA: Diagnosis not present

## 2017-04-11 MED FILL — DOXYCYCLINE HYCLATE 100 MG: 100 | 30 days supply | Qty: 30 | Fill #1

## 2017-04-23 DIAGNOSIS — L710 Perioral dermatitis: Secondary | ICD-10-CM | POA: Diagnosis not present

## 2017-04-24 DIAGNOSIS — M25511 Pain in right shoulder: Secondary | ICD-10-CM | POA: Diagnosis not present

## 2017-04-24 MED FILL — LEVOTHYROXINE 50 MCG TABLET: 50 | 30 days supply | Qty: 30 | Fill #1

## 2017-05-22 MED FILL — ALENDRONATE NA 70 MG TAB: 70 | 28 days supply | Qty: 4 | Fill #2

## 2017-05-28 MED FILL — LEVOTHYROXINE 50 MCG TABLET: 50 | 30 days supply | Qty: 30 | Fill #2

## 2017-06-27 MED FILL — ALENDRONATE NA 70 MG TAB: 70 | 28 days supply | Qty: 4 | Fill #3

## 2017-06-27 MED FILL — LEVOTHYROXINE 50 MCG TABLET: 50 | 30 days supply | Qty: 30 | Fill #3

## 2017-07-12 DIAGNOSIS — R945 Abnormal results of liver function studies: Secondary | ICD-10-CM | POA: Diagnosis not present

## 2017-07-12 DIAGNOSIS — M8580 Other specified disorders of bone density and structure, unspecified site: Secondary | ICD-10-CM | POA: Diagnosis not present

## 2017-07-12 DIAGNOSIS — E782 Mixed hyperlipidemia: Secondary | ICD-10-CM | POA: Diagnosis not present

## 2017-07-12 DIAGNOSIS — E038 Other specified hypothyroidism: Secondary | ICD-10-CM | POA: Diagnosis not present

## 2017-07-13 MED FILL — ATORVASTATIN 20 MG TABLET: 20 | 90 days supply | Qty: 90 | Fill #0

## 2017-07-24 MED FILL — LEVOTHYROXINE 50 MCG TABLET: 50 | 90 days supply | Qty: 90 | Fill #0

## 2017-08-01 DIAGNOSIS — M25511 Pain in right shoulder: Secondary | ICD-10-CM | POA: Diagnosis not present

## 2017-08-10 MED FILL — ALENDRONATE NA 70 MG TAB: 70 | 28 days supply | Qty: 4 | Fill #4

## 2017-10-16 DIAGNOSIS — E782 Mixed hyperlipidemia: Secondary | ICD-10-CM | POA: Diagnosis not present

## 2017-10-22 MED FILL — ALENDRONATE NA 70 MG TAB: 70 | 28 days supply | Qty: 4 | Fill #5

## 2017-10-22 MED FILL — ATORVASTATIN CALCIUM 20 MG: 20 | 90 days supply | Qty: 90 | Fill #1

## 2017-10-31 DIAGNOSIS — L821 Other seborrheic keratosis: Secondary | ICD-10-CM | POA: Diagnosis not present

## 2017-11-02 MED FILL — LEVOTHYROXINE 50 MCG TABLET: 50 | 90 days supply | Qty: 90 | Fill #1

## 2017-11-02 MED FILL — metroNIDAZOLE 0.75 % CREA: 0.75 | 30 days supply | Qty: 45 | Fill #1

## 2017-11-16 DIAGNOSIS — Z1231 Encounter for screening mammogram for malignant neoplasm of breast: Secondary | ICD-10-CM | POA: Diagnosis not present

## 2017-11-19 MED FILL — ALENDRONATE NA 70 MG TAB: 70 | 28 days supply | Qty: 4 | Fill #0

## 2017-12-18 DIAGNOSIS — M8589 Other specified disorders of bone density and structure, multiple sites: Secondary | ICD-10-CM | POA: Diagnosis not present

## 2017-12-26 ENCOUNTER — Encounter: Payer: Self-pay | Admitting: Nurse Practitioner

## 2017-12-26 ENCOUNTER — Ambulatory Visit: Payer: Self-pay | Admitting: Nurse Practitioner

## 2017-12-26 VITALS — BP 126/82 | HR 83 | Temp 97.7°F | Wt 152.6 lb

## 2017-12-26 DIAGNOSIS — J0100 Acute maxillary sinusitis, unspecified: Secondary | ICD-10-CM

## 2017-12-26 MED ORDER — PSEUDOEPH-BROMPHEN-DM 30-2-10 MG/5ML PO SYRP: 5.0000 mL | ORAL_SOLUTION | Freq: Four times a day (QID) | ORAL | 0 refills | Status: AC | PRN

## 2017-12-26 MED ORDER — FLUTICASONE PROPIONATE 50 MCG/ACT NA SUSP: 2.0000 | Freq: Every day | NASAL | 0 refills | Status: DC

## 2017-12-26 MED ORDER — DOXYCYCLINE HYCLATE 100 MG PO TABS: 100.0000 mg | ORAL_TABLET | Freq: Two times a day (BID) | ORAL | 0 refills | Status: AC

## 2017-12-26 MED FILL — MOMETASONE FUROATE 0.1% CRM: 0.1 | 20 days supply | Qty: 15 | Fill #0

## 2017-12-26 MED FILL — DOXYCYCLINE HYCLATE 100 MG: 100 | 10 days supply | Qty: 20 | Fill #0

## 2017-12-26 MED FILL — FLUTICASONE PROP 50 MCG SPR: 50 | 30 days supply | Qty: 16 | Fill #0

## 2017-12-26 MED FILL — BROMPHENIR-PSEUDOEPHED-DM S: 30-2-10 | 8 days supply | Qty: 150 | Fill #0

## 2017-12-26 NOTE — Patient Instructions (Signed)
Sinusitis, Adult -Take medication as prescribed. -Ibuprofen or Tylenol for pain, fever, or general discomfort. -Increase fluids. -Sleep elevated on at least 2 pillows at bedtime to help with cough. -Use a humidifier or vaporizer when at home and during sleep. -May use a teaspoon of honey or over-the-counter cough drops to help with cough. -May use normal saline nasal spray to help with nasal congestion throughout the day. -May continue use of OTC nasal spray for worsening nasal congestion when needed. -Follow-up if symptoms do not improve.   Sinusitis is soreness and inflammation of your sinuses. Sinuses are hollow spaces in the bones around your face. Your sinuses are located:  Around your eyes.  In the middle of your forehead.  Behind your nose.  In your cheekbones.  Your sinuses and nasal passages are lined with a stringy fluid (mucus). Mucus normally drains out of your sinuses. When your nasal tissues become inflamed or swollen, the mucus can become trapped or blocked so air cannot flow through your sinuses. This allows bacteria, viruses, and funguses to grow, which leads to infection. Sinusitis can develop quickly and last for 7?10 days (acute) or for more than 12 weeks (chronic). Sinusitis often develops after a cold. What are the causes? This condition is caused by anything that creates swelling in the sinuses or stops mucus from draining, including:  Allergies.  Asthma.  Bacterial or viral infection.  Abnormally shaped bones between the nasal passages.  Nasal growths that contain mucus (nasal polyps).  Narrow sinus openings.  Pollutants, such as chemicals or irritants in the air.  A foreign object stuck in the nose.  A fungal infection. This is rare.  What increases the risk? The following factors may make you more likely to develop this condition:  Having allergies or asthma.  Having had a recent cold or respiratory tract infection.  Having structural  deformities or blockages in your nose or sinuses.  Having a weak immune system.  Doing a lot of swimming or diving.  Overusing nasal sprays.  Smoking.  What are the signs or symptoms? The main symptoms of this condition are pain and a feeling of pressure around the affected sinuses. Other symptoms include:  Upper toothache.  Earache.  Headache.  Bad breath.  Decreased sense of smell and taste.  A cough that may get worse at night.  Fatigue.  Fever.  Thick drainage from your nose. The drainage is often green and it may contain pus (purulent).  Stuffy nose or congestion.  Postnasal drip. This is when extra mucus collects in the throat or back of the nose.  Swelling and warmth over the affected sinuses.  Sore throat.  Sensitivity to light.  How is this diagnosed? This condition is diagnosed based on symptoms, a medical history, and a physical exam. To find out if your condition is acute or chronic, your health care provider may:  Look in your nose for signs of nasal polyps.  Tap over the affected sinus to check for signs of infection.  View the inside of your sinuses using an imaging device that has a light attached (endoscope).  If your health care provider suspects that you have chronic sinusitis, you may also:  Be tested for allergies.  Have a sample of mucus taken from your nose (nasal culture) and checked for bacteria.  Have a mucus sample examined to see if your sinusitis is related to an allergy.  If your sinusitis does not respond to treatment and it lasts longer than 8 weeks,  you may have an MRI or CT scan to check your sinuses. These scans also help to determine how severe your infection is. In rare cases, a bone biopsy may be done to rule out more serious types of fungal sinus disease. How is this treated? Treatment for sinusitis depends on the cause and whether your condition is chronic or acute. If a virus is causing your sinusitis, your symptoms  will go away on their own within 10 days. You may be given medicines to relieve your symptoms, including:  Topical nasal decongestants. They shrink swollen nasal passages and let mucus drain from your sinuses.  Antihistamines. These drugs block inflammation that is triggered by allergies. This can help to ease swelling in your nose and sinuses.  Topical nasal corticosteroids. These are nasal sprays that ease inflammation and swelling in your nose and sinuses.  Nasal saline washes. These rinses can help to get rid of thick mucus in your nose.  If your condition is caused by bacteria, you will be given an antibiotic medicine. If your condition is caused by a fungus, you will be given an antifungal medicine. Surgery may be needed to correct underlying conditions, such as narrow nasal passages. Surgery may also be needed to remove polyps. Follow these instructions at home: Medicines  Take, use, or apply over-the-counter and prescription medicines only as told by your health care provider. These may include nasal sprays.  If you were prescribed an antibiotic medicine, take it as told by your health care provider. Do not stop taking the antibiotic even if you start to feel better. Hydrate and Humidify  Drink enough water to keep your urine clear or pale yellow. Staying hydrated will help to thin your mucus.  Use a cool mist humidifier to keep the humidity level in your home above 50%.  Inhale steam for 10-15 minutes, 3-4 times a day or as told by your health care provider. You can do this in the bathroom while a hot shower is running.  Limit your exposure to cool or dry air. Rest  Rest as much as possible.  Sleep with your head raised (elevated).  Make sure to get enough sleep each night. General instructions  Apply a warm, moist washcloth to your face 3-4 times a day or as told by your health care provider. This will help with discomfort.  Wash your hands often with soap and water to  reduce your exposure to viruses and other germs. If soap and water are not available, use hand sanitizer.  Do not smoke. Avoid being around people who are smoking (secondhand smoke).  Keep all follow-up visits as told by your health care provider. This is important. Contact a health care provider if:  You have a fever.  Your symptoms get worse.  Your symptoms do not improve within 10 days. Get help right away if:  You have a severe headache.  You have persistent vomiting.  You have pain or swelling around your face or eyes.  You have vision problems.  You develop confusion.  Your neck is stiff.  You have trouble breathing. This information is not intended to replace advice given to you by your health care provider. Make sure you discuss any questions you have with your health care provider. Document Released: 01/23/2005 Document Revised: 09/19/2015 Document Reviewed: 11/18/2014 Elsevier Interactive Patient Education  [employer].

## 2017-12-26 NOTE — Progress Notes (Signed)
Subjective:  Jennifer Pitts is a 62 y.o. female who presents for evaluation of possible sinusitis.  Symptoms include coryza, facial pain, nasal congestion, non productive cough, productive cough with green colored sputum, sinus pressure, sinus pain, sneezing and sore throat.  Onset of symptoms was 1 week ago, and has been unchanged since that time.  Treatment to date:  antihistamines, Nyquil, Tylenol Cold & Sinus.  High risk factors for influenza complications:  co-morbid illness, patient has a history of high cholesterol and hypothyroidism.  The following portions of the patient's history were reviewed and updated as appropriate:  allergies, current medications and past medical history.  Constitutional: positive for anorexia and fatigue, negative for night sweats, sweats and weight loss Eyes: negative Ears, nose, mouth, throat, and face: positive for nasal congestion, sore throat and right ear pain/pressure, post-nasal drip, hoarseness, negative for ear drainage and earaches Respiratory: positive for cough, sputum and wheezing, negative for asthma, chronic bronchitis, dyspnea on exertion and pneumonia Cardiovascular: negative Gastrointestinal: negative Neurological: negative Allergic/Immunologic: positive for hay fever, negative for anaphylaxis and angioedema   Objective:  BP 126/82   Pulse 83   Temp 97.7 F (36.5 C)   Wt 152 lb 9.6 oz (69.2 kg)   SpO2 99%   BMI 27.91 kg/m  General appearance: alert, cooperative, fatigued and no distress Head: Normocephalic, without obvious abnormality, atraumatic Eyes: conjunctivae/corneas clear. PERRL, EOM's intact. Fundi benign. Ears: normal TM and external ear canal left ear and abnormal TM right ear - mucoid middle ear fluid Nose: no discharge, moderate congestion, turbinates swollen, inflamed, moderate maxillary sinus tenderness bilateral, no frontal sinus tenderness bilateral Throat: lips, mucosa, and tongue normal; teeth and gums  normal Lungs: clear to auscultation bilaterally Heart: regular rate and rhythm, S1, S2 normal, no murmur, click, rub or gallop Abdomen: soft, non-tender; bowel sounds normal; no masses,  no organomegaly Pulses: 2+ and symmetric Skin: Skin color, texture, turgor normal. No rashes or lesions Lymph nodes: cervical and submandibular nodes normal Neurologic: Grossly normal    Assessment:  Acute Maxillary Sinusitis   Plan:  Exam findings, diagnosis etiology and medication use and indications reviewed with patient. Follow- Up and discharge instructions provided. No emergent/urgent issues found on exam. Patient education was provided. Patient verbalized understanding of information provided and agrees with plan of care (POC), all questions answered. The patient is advised to call or return to clinic if condition does not see an improvement in symptoms, or to seek the care of the closest emergency department if condition worsens with the above plan.   1. Acute maxillary sinusitis, recurrence not specified  - doxycycline (VIBRA-TABS) 100 MG tablet; Take 1 tablet (100 mg total) by mouth 2 (two) times daily for 10 days.  Dispense: 20 tablet; Refill: 0 - fluticasone (FLONASE) 50 MCG/ACT nasal spray; Place 2 sprays into both nostrils daily for 10 days.  Dispense: 16 g; Refill: 0 - brompheniramine-pseudoephedrine-DM 30-2-10 MG/5ML syrup; Take 5 mLs by mouth 4 (four) times daily as needed for up to 7 days.  Dispense: 150 mL; Refill: 0 -Take medication as prescribed. -Ibuprofen or Tylenol for pain, fever, or general discomfort. -Increase fluids. -Sleep elevated on at least 2 pillows at bedtime to help with cough. -Use a humidifier or vaporizer when at home and during sleep. -May use a teaspoon of honey or over-the-counter cough drops to help with cough. -May use normal saline nasal spray to help with nasal congestion throughout the day. -May continue use of OTC nasal spray for worsening nasal  congestion  when needed. -Follow-up if symptoms do not improve.

## 2018-01-22 MED FILL — ATORVASTATIN CALCIUM 20 MG: 20 | 90 days supply | Qty: 90 | Fill #2

## 2018-01-22 MED FILL — ALENDRONATE NA 70 MG TAB: 70 | 28 days supply | Qty: 4 | Fill #1

## 2018-02-04 MED FILL — LEVOTHYROXINE 50 MCG TABLET: 50 | 90 days supply | Qty: 90 | Fill #0

## 2018-03-25 ENCOUNTER — Ambulatory Visit: Payer: Self-pay | Admitting: Nurse Practitioner

## 2018-03-25 VITALS — BP 120/80 | HR 101 | Temp 97.6°F | Resp 20 | Wt 148.0 lb

## 2018-03-25 DIAGNOSIS — B9789 Other viral agents as the cause of diseases classified elsewhere: Secondary | ICD-10-CM

## 2018-03-25 DIAGNOSIS — J019 Acute sinusitis, unspecified: Secondary | ICD-10-CM

## 2018-03-25 MED ORDER — FLUTICASONE PROPIONATE 50 MCG/ACT NA SUSP: 2.0000 | Freq: Every day | NASAL | 0 refills | Status: DC

## 2018-03-25 MED ORDER — MONTELUKAST SODIUM 10 MG PO TABS: 10.0000 mg | ORAL_TABLET | Freq: Every day | ORAL | 0 refills | Status: DC

## 2018-03-25 MED ORDER — PSEUDOEPH-BROMPHEN-DM 30-2-10 MG/5ML PO SYRP: 5.0000 mL | ORAL_SOLUTION | Freq: Four times a day (QID) | ORAL | 0 refills | Status: AC | PRN

## 2018-03-25 MED FILL — BROMPHENIR-PSEUDOEPHED-DM S: 30-2-10 | 7 days supply | Qty: 150 | Fill #0

## 2018-03-25 MED FILL — FLUTICASONE PROP 50 MCG SPR: 50 | 30 days supply | Qty: 16 | Fill #0

## 2018-03-25 MED FILL — MONTELUKAST SOD 10 MG TAB: 10 | 30 days supply | Qty: 30 | Fill #0

## 2018-03-25 MED FILL — ALENDRONATE NA 70 MG TAB: 70 | 28 days supply | Qty: 4 | Fill #2 | Status: TO

## 2018-03-25 NOTE — Patient Instructions (Signed)
Sinusitis, Adult -Take medication as prescribed. -Ibuprofen or Tylenol for pain, fever, or general discomfort. -Increase fluids. -Get plenty of rest. -Sleep elevated on at least 2 pillows at bedtime to help with cough. -Use a humidifier or vaporizer when at home and during sleep to help with cough and nasal congestion. -May use OTC Afrin nasal spray at bedtime only to help with nasal congestion.  Do not use for more than 3 days. -May use a teaspoon of honey or over-the-counter cough drops to help with cough. -May use normal saline nasal spray to help with nasal congestion throughout the day. -Follow-up in 7-10 days if symptoms do not improve or sooner if symptoms worsen.   Sinusitis is inflammation of your sinuses. Sinuses are hollow spaces in the bones around your face. Your sinuses are located:  Around your eyes.  In the middle of your forehead.  Behind your nose.  In your cheekbones. Mucus normally drains out of your sinuses. When your nasal tissues become inflamed or swollen, mucus can become trapped or blocked. This allows bacteria, viruses, and fungi to grow, which leads to infection. Most infections of the sinuses are caused by a virus. Sinusitis can develop quickly. It can last for up to 4 weeks (acute) or for more than 12 weeks (chronic). Sinusitis often develops after a cold. What are the causes? This condition is caused by anything that creates swelling in the sinuses or stops mucus from draining. This includes:  Allergies.  Asthma.  Infection from bacteria or viruses.  Deformities or blockages in your nose or sinuses.  Abnormal growths in the nose (nasal polyps).  Pollutants, such as chemicals or irritants in the air.  Infection from fungi (rare). What increases the risk? You are more likely to develop this condition if you:  Have a weak body defense system (immune system).  Do a lot of swimming or diving.  Overuse nasal sprays.  Smoke. What are the signs  or symptoms? The main symptoms of this condition are pain and a feeling of pressure around the affected sinuses. Other symptoms include:  Stuffy nose or congestion.  Thick drainage from your nose.  Swelling and warmth over the affected sinuses.  Headache.  Upper toothache.  A cough that may get worse at night.  Extra mucus that collects in the throat or the back of the nose (postnasal drip).  Decreased sense of smell and taste.  Fatigue.  A fever.  Sore throat.  Bad breath. How is this diagnosed? This condition is diagnosed based on:  Your symptoms.  Your medical history.  A physical exam.  Tests to find out if your condition is acute or chronic. This may include: ? Checking your nose for nasal polyps. ? Viewing your sinuses using a device that has a light (endoscope). ? Testing for allergies or bacteria. ? Imaging tests, such as an MRI or CT scan. In rare cases, a bone biopsy may be done to rule out more serious types of fungal sinus disease. How is this treated? Treatment for sinusitis depends on the cause and whether your condition is chronic or acute.  If caused by a virus, your symptoms should go away on their own within 10 days. You may be given medicines to relieve symptoms. They include: ? Medicines that shrink swollen nasal passages (topical intranasal decongestants). ? Medicines that treat allergies (antihistamines). ? A spray that eases inflammation of the nostrils (topical intranasal corticosteroids). ? Rinses that help get rid of thick mucus in your nose (nasal  saline washes).  If caused by bacteria, your health care provider may recommend waiting to see if your symptoms improve. Most bacterial infections will get better without antibiotic medicine. You may be given antibiotics if you have: ? A severe infection. ? A weak immune system.  If caused by narrow nasal passages or nasal polyps, you may need to have surgery. Follow these instructions at  home: Medicines  Take, use, or apply over-the-counter and prescription medicines only as told by your health care provider. These may include nasal sprays.  If you were prescribed an antibiotic medicine, take it as told by your health care provider. Do not stop taking the antibiotic even if you start to feel better. Hydrate and humidify   Drink enough fluid to keep your urine pale yellow. Staying hydrated will help to thin your mucus.  Use a cool mist humidifier to keep the humidity level in your home above 50%.  Inhale steam for 10-15 minutes, 3-4 times a day, or as told by your health care provider. You can do this in the bathroom while a hot shower is running.  Limit your exposure to cool or dry air. Rest  Rest as much as possible.  Sleep with your head raised (elevated).  Make sure you get enough sleep each night. General instructions   Apply a warm, moist washcloth to your face 3-4 times a day or as told by your health care provider. This will help with discomfort.  Wash your hands often with soap and water to reduce your exposure to germs. If soap and water are not available, use hand sanitizer.  Do not smoke. Avoid being around people who are smoking (secondhand smoke).  Keep all follow-up visits as told by your health care provider. This is important. Contact a health care provider if:  You have a fever.  Your symptoms get worse.  Your symptoms do not improve within 10 days. Get help right away if:  You have a severe headache.  You have persistent vomiting.  You have severe pain or swelling around your face or eyes.  You have vision problems.  You develop confusion.  Your neck is stiff.  You have trouble breathing. Summary  Sinusitis is soreness and inflammation of your sinuses. Sinuses are hollow spaces in the bones around your face.  This condition is caused by nasal tissues that become inflamed or swollen. The swelling traps or blocks the flow of  mucus. This allows bacteria, viruses, and fungi to grow, which leads to infection.  If you were prescribed an antibiotic medicine, take it as told by your health care provider. Do not stop taking the antibiotic even if you start to feel better.  Keep all follow-up visits as told by your health care provider. This is important. This information is not intended to replace advice given to you by your health care provider. Make sure you discuss any questions you have with your health care provider. Document Released: 01/23/2005 Document Revised: 06/25/2017 Document Reviewed: 06/25/2017 Elsevier Interactive Patient Education  2019 [employer].

## 2018-03-25 NOTE — Progress Notes (Signed)
Subjective:  Jennifer Pitts is a 63 y.o. female who presents for evaluation of possible sinusitis.  Symptoms include bilateral ear pressure/pain, nasal congestion, no fever, post nasal drip, productive cough with yellow colored sputum and sinus pressure.  Onset of symptoms was 4 days ago, and has been gradually worsening since that time.  Treatment to date:  OTC decongestants.  High risk factors for influenza complications:  none.  Patient was last diagnosed with sinusitis in November, 2019.  Patient informs that she normally develops sinus symptoms when there are changes in the weather.  The following portions of the patient's history were reviewed and updated as appropriate:  allergies, current medications and past medical history.  Past Medical History:  Diagnosis Date  . Family history of anesthesia complication    " made my mom kind odf loopy"  . Hyperlipidemia   . Menopausal state    Current Outpatient Medications on File Prior to Visit  Medication Sig Dispense Refill  . atorvastatin (LIPITOR) 10 MG tablet Take 10 mg by mouth daily.    . cetirizine (ZYRTEC) 10 MG tablet Take 10 mg by mouth daily.    . cholecalciferol (VITAMIN D) 1000 UNITS tablet Take 1,000 Units by mouth daily.    [name] estrogen, conjugated,-medroxyprogesterone (PREMPRO) 0.3-1.5 MG per tablet Take 1 tablet by mouth daily.    . methocarbamol (ROBAXIN) 500 MG tablet Take 1 tablet (500 mg total) by mouth 4 (four) times daily. 45 tablet 0  . naproxen sodium (ANAPROX) 220 MG tablet Take 220 mg by mouth daily as needed. For pain    . oxycodone (OXY-IR) 5 MG capsule Take 5 mg by mouth every 4 (four) hours as needed for pain.    . valACYclovir (VALTREX) 1000 MG tablet Take 1,000 mg by mouth 2 (two) times daily as needed (fever blisters).     . doxycycline (VIBRAMYCIN) 50 MG capsule Take 1 capsule (50 mg total) by mouth 2 (two) times daily. (Patient not taking: Reported on 03/25/2018) 14 capsule 0  . fluticasone (FLONASE) 50  MCG/ACT nasal spray Place 2 sprays into both nostrils daily for 10 days. 16 g 0   No current facility-administered medications on file prior to visit.    Constitutional: positive for anorexia and fatigue, negative for chills, fevers and malaise Eyes: negative Ears, nose, mouth, throat, and face: positive for nasal congestion, sore throat and postnasal drip, bilateral ear fullness, negative for ear drainage, earaches and hoarseness Respiratory: positive for cough and sputum, negative for asthma, chronic bronchitis, dyspnea on exertion, pneumonia, stridor and wheezing Cardiovascular: negative Gastrointestinal: positive for decreased appetite, negative for abdominal pain, diarrhea, nausea and vomiting Neurological: positive for headaches, negative for coordination problems, dizziness, gait problems, paresthesia, vertigo and weakness Allergic/Immunologic: positive for hay fever Objective:  BP 120/80 (BP Location: Right Arm, Patient Position: Sitting, Cuff Size: Normal)   Pulse (!) 101   Temp 97.6 F (36.4 C) (Oral)   Resp 20   Wt 148 lb (67.1 kg)   SpO2 97%   BMI 27.07 kg/m  Physical Exam Vitals signs reviewed.  Constitutional:      General: She is not in acute distress. HENT:     Head: Normocephalic.     Right Ear: Tympanic membrane, ear canal and external ear normal.     Left Ear: Tympanic membrane, ear canal and external ear normal.     Nose: Mucosal edema, congestion (moderate) and rhinorrhea (clear) present.     Right Turbinates: Enlarged and swollen.  Left Turbinates: Enlarged and swollen.     Right Sinus: Maxillary sinus tenderness and frontal sinus tenderness present.     Left Sinus: Maxillary sinus tenderness and frontal sinus tenderness present.     Mouth/Throat:     Lips: Pink.     Mouth: Mucous membranes are moist.     Pharynx: Uvula midline. Posterior oropharyngeal erythema present. No pharyngeal swelling, oropharyngeal exudate or uvula swelling.     Tonsils: No  tonsillar exudate. Swelling: 0 on the right. 0 on the left.  Eyes:     Conjunctiva/sclera: Conjunctivae normal.     Pupils: Pupils are equal, round, and reactive to light.  Neck:     Musculoskeletal: Normal range of motion and neck supple.  Cardiovascular:     Rate and Rhythm: Regular rhythm. Tachycardia present.     Pulses: Normal pulses.     Heart sounds: Normal heart sounds.     Comments: mild Pulmonary:     Effort: Pulmonary effort is normal. No respiratory distress.     Breath sounds: Normal breath sounds. No stridor. No wheezing, rhonchi or rales.  Abdominal:     General: Abdomen is flat. Bowel sounds are normal.     Tenderness: There is no abdominal tenderness.  Lymphadenopathy:     Cervical: No cervical adenopathy.  Skin:    General: Skin is warm and dry.  Neurological:     General: No focal deficit present.     Mental Status: She is alert and oriented to person, place, and time.     Cranial Nerves: No cranial nerve deficit.  Psychiatric:        Mood and Affect: Mood normal.        Thought Content: Thought content normal.      Assessment:  Acute Viral Pharyngitis    Plan:  Exam findings, diagnosis etiology and medication use and indications reviewed with patient. Follow- Up and discharge instructions provided. No emergent/urgent issues found on exam.  Some the patient's clinical presentation, symptoms, physical assessment, patient's findings are congruent with that of viral etiology.  Patient does not display high fever, purulent nasal drainage, or double sickening.  I am going to provide the patient with symptomatic treatment to include Bromfed, angular, and fluticasone.  Patient will also perform symptomatic treatment to include ibuprofen or Tylenol for pain, fever, or general discomfort.  Increasing fluids, using a humidifier or vaporizer for nasal congestion and cough, and also using Afrin as needed at bedtime for nasal congestion.  Informed patient that I would like  her to attempt symptomatic treatment for at least 7 to 10 days; however, if patient's symptoms do not improve, worsen, or she has other concerns, I encouraged her to return prior to that time.  Patient's vitals are stable with the exception of some very mild tachycardia, patient is in no acute distress, and lungs TAB.  Patient education was provided. Patient verbalized understanding of information provided and agrees with plan of care (POC), all questions answered. The patient is advised to call or return to clinic if condition does not see an improvement in symptoms, or to seek the care of the closest emergency department if condition worsens with the above plan.   1. Acute viral sinusitis  - montelukast (SINGULAIR) 10 MG tablet; Take 1 tablet (10 mg total) by mouth at bedtime for 30 days.  Dispense: 30 tablet; Refill: 0 - brompheniramine-pseudoephedrine-DM 30-2-10 MG/5ML syrup; Take 5 mLs by mouth 4 (four) times daily as needed for up to 7  days.  Dispense: 150 mL; Refill: 0 - fluticasone (FLONASE) 50 MCG/ACT nasal spray; Place 2 sprays into both nostrils daily for 10 days.  Dispense: 16 g; Refill: 0 -Take medication as prescribed. -Ibuprofen or Tylenol for pain, fever, or general discomfort. -Increase fluids. -Get plenty of rest. -Sleep elevated on at least 2 pillows at bedtime to help with cough. -Use a humidifier or vaporizer when at home and during sleep to help with cough and nasal congestion. -May use OTC Afrin nasal spray at bedtime only to help with nasal congestion.  Do not use for more than 3 days. -May use a teaspoon of honey or over-the-counter cough drops to help with cough. -May use normal saline nasal spray to help with nasal congestion throughout the day. -Follow-up in 7-10 days if symptoms do not improve or sooner if symptoms worsen.

## 2018-03-27 ENCOUNTER — Telehealth: Payer: Self-pay

## 2018-03-27 NOTE — Telephone Encounter (Signed)
Called and f/u with patient on how she was feeling since her visit Monday and she states she is doing better.

## 2018-04-30 MED FILL — ATORVASTATIN 20 MG TABLET: 20 | 90 days supply | Qty: 90 | Fill #0

## 2018-04-30 MED FILL — LEVOTHYROXINE 50 MCG TABLET: 50 | 90 days supply | Qty: 90 | Fill #0

## 2018-04-30 MED FILL — ALENDRONATE NA 70 MG TAB: 70 | 28 days supply | Qty: 4 | Fill #0

## 2018-07-03 MED FILL — ALENDRONATE NA 70 MG TAB: 70 | 28 days supply | Qty: 4 | Fill #1

## 2018-07-11 DIAGNOSIS — M8000XD Age-related osteoporosis with current pathological fracture, unspecified site, subsequent encounter for fracture with routine healing: Secondary | ICD-10-CM | POA: Diagnosis not present

## 2018-07-11 DIAGNOSIS — Z124 Encounter for screening for malignant neoplasm of cervix: Secondary | ICD-10-CM | POA: Diagnosis not present

## 2018-07-11 DIAGNOSIS — E782 Mixed hyperlipidemia: Secondary | ICD-10-CM | POA: Diagnosis not present

## 2018-07-11 DIAGNOSIS — Z1239 Encounter for other screening for malignant neoplasm of breast: Secondary | ICD-10-CM | POA: Diagnosis not present

## 2018-07-11 DIAGNOSIS — Z1211 Encounter for screening for malignant neoplasm of colon: Secondary | ICD-10-CM | POA: Diagnosis not present

## 2018-07-11 DIAGNOSIS — R7301 Impaired fasting glucose: Secondary | ICD-10-CM | POA: Diagnosis not present

## 2018-07-11 DIAGNOSIS — N951 Menopausal and female climacteric states: Secondary | ICD-10-CM | POA: Diagnosis not present

## 2018-07-11 DIAGNOSIS — Z23 Encounter for immunization: Secondary | ICD-10-CM | POA: Diagnosis not present

## 2018-07-11 DIAGNOSIS — E039 Hypothyroidism, unspecified: Secondary | ICD-10-CM | POA: Diagnosis not present

## 2018-07-11 DIAGNOSIS — Z0000 Encounter for general adult medical examination without abnormal findings: Secondary | ICD-10-CM | POA: Diagnosis not present

## 2018-07-11 MED FILL — valACYclovir HCL 1 GM TABS: 1 | 7 days supply | Qty: 30 | Fill #0

## 2018-07-11 MED FILL — metroNIDAZOLE 0.75 % CREA: 0.75 | 30 days supply | Qty: 45 | Fill #0

## 2018-07-31 ENCOUNTER — Telehealth: Payer: 59 | Admitting: Family

## 2018-07-31 DIAGNOSIS — R399 Unspecified symptoms and signs involving the genitourinary system: Secondary | ICD-10-CM

## 2018-07-31 MED ORDER — NITROFURANTOIN MONOHYD MACRO 100 MG PO CAPS: 100.0000 mg | ORAL_CAPSULE | Freq: Two times a day (BID) | ORAL | 0 refills | Status: DC

## 2018-07-31 MED FILL — NITROFURANTOIN MONO-MCR 100: 100 | 5 days supply | Qty: 10 | Fill #0

## 2018-07-31 NOTE — Progress Notes (Signed)
We are sorry that you are not feeling well.  Here is how we plan to help!  Based on what you shared with me it looks like you most likely have a simple urinary tract infection.  A UTI (Urinary Tract Infection) is a bacterial infection of the bladder.  Most cases of urinary tract infections are simple to treat but a key part of your care is to encourage you to drink plenty of fluids and watch your symptoms carefully.  I have prescribed MacroBid 100 mg twice a day for 5 days.  Your symptoms should gradually improve. Call us if the burning in your urine worsens, you develop worsening fever, back pain or pelvic pain or if your symptoms do not resolve after completing the antibiotic.  Approximately 5 minutes was spent documenting and reviewing patient's chart.   Urinary tract infections can be prevented by drinking plenty of water to keep your body hydrated.  Also be sure when you wipe, wipe from front to back and don't hold it in!  If possible, empty your bladder every 4 hours.  Your e-visit answers were reviewed by a board certified advanced clinical practitioner to complete your personal care plan.  Depending on the condition, your plan could have included both over the counter or prescription medications.  If there is a problem please reply  once you have received a response from your provider.  Your safety is important to us.  If you have drug allergies check your prescription carefully.    You can use MyChart to ask questions about today's visit, request a non-urgent call back, or ask for a work or school excuse for 24 hours related to this e-Visit. If it has been greater than 24 hours you will need to follow up with your provider, or enter a new e-Visit to address those concerns.   You will get an e-mail in the next two days asking about your experience.  I hope that your e-visit has been valuable and will speed your recovery. Thank you for using e-visits.    

## 2018-08-05 DIAGNOSIS — L710 Perioral dermatitis: Secondary | ICD-10-CM | POA: Diagnosis not present

## 2018-08-05 MED FILL — ATORVASTATIN 20 MG TABLET: 20 | 90 days supply | Qty: 90 | Fill #0

## 2018-08-05 MED FILL — DOXYCYCLINE HYC 100 MG CAPS: 100 | 14 days supply | Qty: 28 | Fill #0

## 2018-08-16 MED FILL — DOXYCYCLINE HYC 100 MG CAPS: 100 | 14 days supply | Qty: 28 | Fill #0

## 2018-08-17 MED FILL — LEVOTHYROXINE 50 MCG TABLET: 50 | 90 days supply | Qty: 90 | Fill #1

## 2018-08-21 MED FILL — SUPREP BOWEL PREP KIT: 17.5-3.13-1 | 2 days supply | Qty: 354 | Fill #0

## 2018-08-30 DIAGNOSIS — Z1211 Encounter for screening for malignant neoplasm of colon: Secondary | ICD-10-CM | POA: Diagnosis not present

## 2018-08-30 DIAGNOSIS — Z800 Family history of malignant neoplasm of digestive organs: Secondary | ICD-10-CM | POA: Diagnosis not present

## 2018-09-03 MED FILL — ALENDRONATE NA 70 MG TAB: 70 | 28 days supply | Qty: 4 | Fill #2

## 2018-10-31 MED FILL — ALENDRONATE NA 70 MG TAB: 70 | 84 days supply | Qty: 12 | Fill #0

## 2018-11-12 MED FILL — ATORVASTATIN 20 MG TABLET: 20 | 90 days supply | Qty: 90 | Fill #1

## 2018-11-28 MED FILL — LEVOTHYROXINE 50 MCG TABLET: 50 | 90 days supply | Qty: 90 | Fill #2

## 2018-12-25 DIAGNOSIS — Z1231 Encounter for screening mammogram for malignant neoplasm of breast: Secondary | ICD-10-CM | POA: Diagnosis not present

## 2018-12-25 DIAGNOSIS — M8589 Other specified disorders of bone density and structure, multiple sites: Secondary | ICD-10-CM | POA: Diagnosis not present

## 2019-01-13 DIAGNOSIS — E039 Hypothyroidism, unspecified: Secondary | ICD-10-CM | POA: Diagnosis not present

## 2019-01-13 DIAGNOSIS — R7989 Other specified abnormal findings of blood chemistry: Secondary | ICD-10-CM | POA: Diagnosis not present

## 2019-01-13 DIAGNOSIS — E782 Mixed hyperlipidemia: Secondary | ICD-10-CM | POA: Diagnosis not present

## 2019-01-20 MED FILL — ALENDRONATE NA 70 MG TAB: 70 | 84 days supply | Qty: 12 | Fill #1

## 2019-02-17 MED FILL — ATORVASTATIN 20 MG TABLET: 20 | 90 days supply | Qty: 90 | Fill #2

## 2019-03-13 DIAGNOSIS — E039 Hypothyroidism, unspecified: Secondary | ICD-10-CM | POA: Diagnosis not present

## 2019-05-01 MED FILL — LEVOTHYROXINE 50 MCG TABLET: 50 | 90 days supply | Qty: 90 | Fill #0

## 2019-05-16 DIAGNOSIS — M25541 Pain in joints of right hand: Secondary | ICD-10-CM | POA: Diagnosis not present

## 2019-05-16 DIAGNOSIS — M25542 Pain in joints of left hand: Secondary | ICD-10-CM | POA: Diagnosis not present

## 2019-05-16 DIAGNOSIS — E039 Hypothyroidism, unspecified: Secondary | ICD-10-CM | POA: Diagnosis not present

## 2019-05-19 ENCOUNTER — Other Ambulatory Visit (HOSPITAL_COMMUNITY): Payer: Self-pay | Admitting: Family Medicine

## 2019-05-19 MED FILL — ATORVASTATIN 20 MG TABLET: 20 | 90 days supply | Qty: 90 | Fill #0

## 2019-05-21 MED FILL — NABUMETONE 500 MG TABLET: 500 | 30 days supply | Qty: 60 | Fill #0

## 2019-08-07 DIAGNOSIS — E039 Hypothyroidism, unspecified: Secondary | ICD-10-CM | POA: Diagnosis not present

## 2019-08-07 DIAGNOSIS — E782 Mixed hyperlipidemia: Secondary | ICD-10-CM | POA: Diagnosis not present

## 2019-08-07 DIAGNOSIS — M810 Age-related osteoporosis without current pathological fracture: Secondary | ICD-10-CM | POA: Diagnosis not present

## 2019-08-07 DIAGNOSIS — R7301 Impaired fasting glucose: Secondary | ICD-10-CM | POA: Diagnosis not present

## 2019-08-07 DIAGNOSIS — Z0001 Encounter for general adult medical examination with abnormal findings: Secondary | ICD-10-CM | POA: Diagnosis not present

## 2019-08-14 DIAGNOSIS — J019 Acute sinusitis, unspecified: Secondary | ICD-10-CM | POA: Diagnosis not present

## 2019-08-18 MED FILL — ATORVASTATIN 20 MG TABLET: 20 | 90 days supply | Qty: 90 | Fill #1

## 2019-09-05 MED FILL — ALENDRONATE NA 70 MG TAB: 70 | 84 days supply | Qty: 12 | Fill #2

## 2019-09-15 DIAGNOSIS — L918 Other hypertrophic disorders of the skin: Secondary | ICD-10-CM | POA: Diagnosis not present

## 2019-10-27 MED FILL — LEVOTHYROXINE 50 MCG TABLET: 50 | 90 days supply | Qty: 90 | Fill #1

## 2019-11-07 DIAGNOSIS — U071 COVID-19: Secondary | ICD-10-CM | POA: Diagnosis not present

## 2019-11-24 MED FILL — ATORVASTATIN CALCIUM 20 MG: 20 | 90 days supply | Qty: 90 | Fill #2

## 2020-02-17 DIAGNOSIS — E782 Mixed hyperlipidemia: Secondary | ICD-10-CM | POA: Diagnosis not present

## 2020-02-19 ENCOUNTER — Other Ambulatory Visit (HOSPITAL_COMMUNITY): Payer: Self-pay | Admitting: Family Medicine

## 2020-02-19 MED FILL — ATORVASTATIN 40 MG TABLET: 40 | 90 days supply | Qty: 90 | Fill #0

## 2020-02-24 DIAGNOSIS — M25511 Pain in right shoulder: Secondary | ICD-10-CM | POA: Diagnosis not present

## 2020-02-24 DIAGNOSIS — Z1231 Encounter for screening mammogram for malignant neoplasm of breast: Secondary | ICD-10-CM | POA: Diagnosis not present

## 2020-04-05 DIAGNOSIS — L309 Dermatitis, unspecified: Secondary | ICD-10-CM | POA: Diagnosis not present

## 2020-04-16 DIAGNOSIS — E782 Mixed hyperlipidemia: Secondary | ICD-10-CM | POA: Diagnosis not present

## 2020-04-29 ENCOUNTER — Other Ambulatory Visit (HOSPITAL_BASED_OUTPATIENT_CLINIC_OR_DEPARTMENT_OTHER): Payer: Self-pay

## 2020-04-30 ENCOUNTER — Other Ambulatory Visit (HOSPITAL_COMMUNITY): Payer: Self-pay | Admitting: Family Medicine

## 2020-04-30 MED FILL — LEVOTHYROXINE 25 MCG TABLET: 25 | 90 days supply | Qty: 90 | Fill #0

## 2020-05-18 ENCOUNTER — Other Ambulatory Visit (HOSPITAL_COMMUNITY): Payer: Self-pay

## 2020-05-18 MED FILL — Atorvastatin Calcium Tab 40 MG (Base Equivalent): ORAL | 90 days supply | Qty: 90 | Fill #0 | Status: AC

## 2020-05-25 ENCOUNTER — Other Ambulatory Visit (HOSPITAL_COMMUNITY): Payer: Self-pay

## 2020-08-02 ENCOUNTER — Other Ambulatory Visit (HOSPITAL_COMMUNITY): Payer: Self-pay

## 2020-08-02 MED FILL — Levothyroxine Sodium Tab 25 MCG: ORAL | 90 days supply | Qty: 90 | Fill #0 | Status: AC

## 2020-08-17 DIAGNOSIS — J019 Acute sinusitis, unspecified: Secondary | ICD-10-CM | POA: Diagnosis not present

## 2020-08-24 DIAGNOSIS — R7301 Impaired fasting glucose: Secondary | ICD-10-CM | POA: Diagnosis not present

## 2020-08-24 DIAGNOSIS — M8000XD Age-related osteoporosis with current pathological fracture, unspecified site, subsequent encounter for fracture with routine healing: Secondary | ICD-10-CM | POA: Diagnosis not present

## 2020-08-24 DIAGNOSIS — E039 Hypothyroidism, unspecified: Secondary | ICD-10-CM | POA: Diagnosis not present

## 2020-08-24 DIAGNOSIS — Z0000 Encounter for general adult medical examination without abnormal findings: Secondary | ICD-10-CM | POA: Diagnosis not present

## 2020-08-24 DIAGNOSIS — E782 Mixed hyperlipidemia: Secondary | ICD-10-CM | POA: Diagnosis not present

## 2020-08-24 DIAGNOSIS — Z23 Encounter for immunization: Secondary | ICD-10-CM | POA: Diagnosis not present

## 2020-09-01 ENCOUNTER — Other Ambulatory Visit (HOSPITAL_COMMUNITY): Payer: Self-pay

## 2020-09-06 ENCOUNTER — Other Ambulatory Visit (HOSPITAL_COMMUNITY): Payer: Self-pay

## 2020-09-06 MED FILL — Atorvastatin Calcium Tab 40 MG (Base Equivalent): ORAL | 90 days supply | Qty: 90 | Fill #1 | Status: AC

## 2020-09-20 DIAGNOSIS — M25571 Pain in right ankle and joints of right foot: Secondary | ICD-10-CM | POA: Diagnosis not present

## 2020-09-20 DIAGNOSIS — S8264XA Nondisplaced fracture of lateral malleolus of right fibula, initial encounter for closed fracture: Secondary | ICD-10-CM | POA: Diagnosis not present

## 2020-09-27 DIAGNOSIS — M25571 Pain in right ankle and joints of right foot: Secondary | ICD-10-CM | POA: Diagnosis not present

## 2020-09-27 DIAGNOSIS — S8264XA Nondisplaced fracture of lateral malleolus of right fibula, initial encounter for closed fracture: Secondary | ICD-10-CM | POA: Diagnosis not present

## 2020-10-15 DIAGNOSIS — M25571 Pain in right ankle and joints of right foot: Secondary | ICD-10-CM | POA: Diagnosis not present

## 2020-10-15 DIAGNOSIS — S8264XA Nondisplaced fracture of lateral malleolus of right fibula, initial encounter for closed fracture: Secondary | ICD-10-CM | POA: Diagnosis not present

## 2020-11-08 ENCOUNTER — Other Ambulatory Visit (HOSPITAL_COMMUNITY): Payer: Self-pay

## 2020-11-08 MED FILL — Levothyroxine Sodium Tab 25 MCG: ORAL | 90 days supply | Qty: 90 | Fill #1 | Status: AC

## 2020-11-09 ENCOUNTER — Other Ambulatory Visit (HOSPITAL_COMMUNITY): Payer: Self-pay

## 2020-11-15 DIAGNOSIS — S8264XD Nondisplaced fracture of lateral malleolus of right fibula, subsequent encounter for closed fracture with routine healing: Secondary | ICD-10-CM | POA: Diagnosis not present

## 2020-11-15 DIAGNOSIS — S8264XA Nondisplaced fracture of lateral malleolus of right fibula, initial encounter for closed fracture: Secondary | ICD-10-CM | POA: Diagnosis not present

## 2020-11-26 DIAGNOSIS — M25571 Pain in right ankle and joints of right foot: Secondary | ICD-10-CM | POA: Diagnosis not present

## 2020-12-06 ENCOUNTER — Other Ambulatory Visit (HOSPITAL_COMMUNITY): Payer: Self-pay

## 2020-12-06 MED FILL — Atorvastatin Calcium Tab 40 MG (Base Equivalent): ORAL | 90 days supply | Qty: 90 | Fill #2 | Status: AC

## 2020-12-13 DIAGNOSIS — M25571 Pain in right ankle and joints of right foot: Secondary | ICD-10-CM | POA: Diagnosis not present

## 2020-12-13 DIAGNOSIS — S8264XD Nondisplaced fracture of lateral malleolus of right fibula, subsequent encounter for closed fracture with routine healing: Secondary | ICD-10-CM | POA: Diagnosis not present

## 2020-12-27 DIAGNOSIS — M25512 Pain in left shoulder: Secondary | ICD-10-CM | POA: Diagnosis not present

## 2021-01-14 DIAGNOSIS — Z23 Encounter for immunization: Secondary | ICD-10-CM | POA: Diagnosis not present

## 2021-02-14 ENCOUNTER — Other Ambulatory Visit (HOSPITAL_COMMUNITY): Payer: Self-pay

## 2021-02-14 MED FILL — Levothyroxine Sodium Tab 25 MCG: ORAL | 90 days supply | Qty: 90 | Fill #2 | Status: AC

## 2021-02-15 ENCOUNTER — Other Ambulatory Visit (HOSPITAL_COMMUNITY): Payer: Self-pay

## 2021-03-01 DIAGNOSIS — Z1231 Encounter for screening mammogram for malignant neoplasm of breast: Secondary | ICD-10-CM | POA: Diagnosis not present

## 2021-03-01 DIAGNOSIS — M8589 Other specified disorders of bone density and structure, multiple sites: Secondary | ICD-10-CM | POA: Diagnosis not present

## 2021-03-01 DIAGNOSIS — Z780 Asymptomatic menopausal state: Secondary | ICD-10-CM | POA: Diagnosis not present

## 2021-03-07 ENCOUNTER — Other Ambulatory Visit (HOSPITAL_COMMUNITY): Payer: Self-pay

## 2021-03-07 MED ORDER — ATORVASTATIN CALCIUM 40 MG PO TABS
ORAL_TABLET | ORAL | 3 refills | Status: DC
  Filled 2021-03-07: qty 90, 90d supply, fill #0
  Filled 2021-06-08: qty 90, 90d supply, fill #1
  Filled 2021-09-19: qty 90, 90d supply, fill #2
  Filled 2021-12-14: qty 90, 90d supply, fill #3

## 2021-03-30 ENCOUNTER — Other Ambulatory Visit (HOSPITAL_COMMUNITY): Payer: Self-pay

## 2021-03-30 DIAGNOSIS — M5030 Other cervical disc degeneration, unspecified cervical region: Secondary | ICD-10-CM | POA: Diagnosis not present

## 2021-03-30 DIAGNOSIS — M25512 Pain in left shoulder: Secondary | ICD-10-CM | POA: Diagnosis not present

## 2021-03-30 DIAGNOSIS — M542 Cervicalgia: Secondary | ICD-10-CM | POA: Diagnosis not present

## 2021-03-30 MED ORDER — METHOCARBAMOL 500 MG PO TABS
1000.0000 mg | ORAL_TABLET | Freq: Four times a day (QID) | ORAL | 1 refills | Status: DC
  Filled 2021-03-30: qty 60, 8d supply, fill #0

## 2021-03-30 MED ORDER — PREDNISONE 10 MG (48) PO TBPK
ORAL_TABLET | ORAL | 0 refills | Status: DC
  Filled 2021-03-30: qty 48, 12d supply, fill #0

## 2021-04-21 DIAGNOSIS — M542 Cervicalgia: Secondary | ICD-10-CM | POA: Diagnosis not present

## 2021-04-23 DIAGNOSIS — M25571 Pain in right ankle and joints of right foot: Secondary | ICD-10-CM | POA: Diagnosis not present

## 2021-04-23 DIAGNOSIS — M79671 Pain in right foot: Secondary | ICD-10-CM | POA: Diagnosis not present

## 2021-04-23 DIAGNOSIS — S92354A Nondisplaced fracture of fifth metatarsal bone, right foot, initial encounter for closed fracture: Secondary | ICD-10-CM | POA: Diagnosis not present

## 2021-04-28 DIAGNOSIS — S92354A Nondisplaced fracture of fifth metatarsal bone, right foot, initial encounter for closed fracture: Secondary | ICD-10-CM | POA: Diagnosis not present

## 2021-04-28 DIAGNOSIS — M79671 Pain in right foot: Secondary | ICD-10-CM | POA: Diagnosis not present

## 2021-04-29 ENCOUNTER — Other Ambulatory Visit (HOSPITAL_COMMUNITY): Payer: Self-pay

## 2021-04-29 MED ORDER — IBANDRONATE SODIUM 150 MG PO TABS
ORAL_TABLET | ORAL | 0 refills | Status: DC
  Filled 2021-04-29: qty 12, 84d supply, fill #0
  Filled 2021-05-02: qty 3, 84d supply, fill #0
  Filled 2021-05-03: qty 3, 90d supply, fill #0
  Filled 2021-07-19: qty 3, 90d supply, fill #1
  Filled 2021-10-19: qty 3, 90d supply, fill #2
  Filled 2022-01-13: qty 3, 90d supply, fill #3

## 2021-05-02 ENCOUNTER — Other Ambulatory Visit (HOSPITAL_COMMUNITY): Payer: Self-pay

## 2021-05-03 ENCOUNTER — Other Ambulatory Visit (HOSPITAL_COMMUNITY): Payer: Self-pay

## 2021-05-16 ENCOUNTER — Other Ambulatory Visit (HOSPITAL_COMMUNITY): Payer: Self-pay

## 2021-05-16 MED ORDER — LEVOTHYROXINE SODIUM 25 MCG PO TABS
25.0000 ug | ORAL_TABLET | Freq: Every day | ORAL | 3 refills | Status: DC
  Filled 2021-05-16: qty 90, 90d supply, fill #0
  Filled 2021-08-15: qty 90, 90d supply, fill #1
  Filled 2021-11-28: qty 90, 90d supply, fill #2
  Filled 2022-02-19: qty 90, 90d supply, fill #3

## 2021-05-17 ENCOUNTER — Other Ambulatory Visit (HOSPITAL_COMMUNITY): Payer: Self-pay

## 2021-05-27 DIAGNOSIS — S92354A Nondisplaced fracture of fifth metatarsal bone, right foot, initial encounter for closed fracture: Secondary | ICD-10-CM | POA: Diagnosis not present

## 2021-06-08 ENCOUNTER — Other Ambulatory Visit (HOSPITAL_COMMUNITY): Payer: Self-pay

## 2021-06-27 DIAGNOSIS — M79671 Pain in right foot: Secondary | ICD-10-CM | POA: Diagnosis not present

## 2021-06-27 DIAGNOSIS — S92351D Displaced fracture of fifth metatarsal bone, right foot, subsequent encounter for fracture with routine healing: Secondary | ICD-10-CM | POA: Diagnosis not present

## 2021-07-19 ENCOUNTER — Other Ambulatory Visit (HOSPITAL_COMMUNITY): Payer: Self-pay

## 2021-08-15 ENCOUNTER — Other Ambulatory Visit (HOSPITAL_COMMUNITY): Payer: Self-pay

## 2021-09-15 DIAGNOSIS — E782 Mixed hyperlipidemia: Secondary | ICD-10-CM | POA: Diagnosis not present

## 2021-09-15 DIAGNOSIS — E039 Hypothyroidism, unspecified: Secondary | ICD-10-CM | POA: Diagnosis not present

## 2021-09-19 ENCOUNTER — Other Ambulatory Visit (HOSPITAL_COMMUNITY): Payer: Self-pay

## 2021-09-22 DIAGNOSIS — Z79899 Other long term (current) drug therapy: Secondary | ICD-10-CM | POA: Diagnosis not present

## 2021-09-22 DIAGNOSIS — Z124 Encounter for screening for malignant neoplasm of cervix: Secondary | ICD-10-CM | POA: Diagnosis not present

## 2021-09-22 DIAGNOSIS — E782 Mixed hyperlipidemia: Secondary | ICD-10-CM | POA: Diagnosis not present

## 2021-09-22 DIAGNOSIS — E039 Hypothyroidism, unspecified: Secondary | ICD-10-CM | POA: Diagnosis not present

## 2021-09-22 DIAGNOSIS — Z0000 Encounter for general adult medical examination without abnormal findings: Secondary | ICD-10-CM | POA: Diagnosis not present

## 2021-10-19 ENCOUNTER — Other Ambulatory Visit (HOSPITAL_COMMUNITY): Payer: Self-pay

## 2021-10-28 ENCOUNTER — Ambulatory Visit: Payer: 59 | Admitting: Podiatry

## 2021-11-07 ENCOUNTER — Ambulatory Visit: Payer: 59 | Admitting: Podiatry

## 2021-11-07 DIAGNOSIS — B9689 Other specified bacterial agents as the cause of diseases classified elsewhere: Secondary | ICD-10-CM | POA: Diagnosis not present

## 2021-11-07 DIAGNOSIS — J329 Chronic sinusitis, unspecified: Secondary | ICD-10-CM | POA: Diagnosis not present

## 2021-11-21 ENCOUNTER — Ambulatory Visit (INDEPENDENT_AMBULATORY_CARE_PROVIDER_SITE_OTHER): Payer: 59 | Admitting: Podiatry

## 2021-11-21 ENCOUNTER — Ambulatory Visit (INDEPENDENT_AMBULATORY_CARE_PROVIDER_SITE_OTHER): Payer: 59

## 2021-11-21 DIAGNOSIS — G5761 Lesion of plantar nerve, right lower limb: Secondary | ICD-10-CM

## 2021-11-21 DIAGNOSIS — G5762 Lesion of plantar nerve, left lower limb: Secondary | ICD-10-CM | POA: Diagnosis not present

## 2021-11-21 MED ORDER — BETAMETHASONE SOD PHOS & ACET 6 (3-3) MG/ML IJ SUSP
3.0000 mg | Freq: Once | INTRAMUSCULAR | Status: AC
  Administered 2021-11-21: 3 mg via INTRA_ARTICULAR

## 2021-11-21 NOTE — Progress Notes (Signed)
   Chief Complaint  Patient presents with   Foot Pain    Patient is here for right foot pain.the patient states that she broken the right foot several times, she states that the pain is mainly in her toes.    HPI: 66 y.o. female presenting today as a new patient for evaluation of right forefoot pain this been going on off and on for years now.  Patient describes shooting sensation down to the third and fourth toes of the right forefoot.  She does have a history of fifth metatarsal avulsion fracture with routine healing.  Aggravated with flats and heels.  She says that she wears shoes with arch supports and has no pain with those.  Past Medical History:  Diagnosis Date   Family history of anesthesia complication    " made my mom kind odf loopy"   Hyperlipidemia    Menopausal state     Past Surgical History:  Procedure Laterality Date   COLONOSCOPY     Hx: of   LAPAROSCOPIC ABDOMINAL EXPLORATION     lower venous ultrasound  08/17/2010   No evidence of thrombus or thrombophlebitis. This is essentially a normal left lower extremity venous duplex Doppler evaluation.   ORIF RADIAL FRACTURE Left 04/05/2013   Procedure: OPEN REDUCTION INTERNAL FIXATION (ORIF) LEFT RADIUS FRACTURE WITH REPAIR RECONSTRUCTION AS NECESSARY ;  Surgeon: Roseanne Kaufman, MD;  Location: [place] [facility];  Service: Orthopedics;  Laterality: Left;   TONSILLECTOMY     TUBAL LIGATION      Allergies  Allergen Reactions   Penicillins Rash    itching   Sulfa Antibiotics Rash     Physical Exam: General: The patient is alert and oriented x3 in no acute distress.  Dermatology: Skin is warm, dry and supple bilateral lower extremities. Negative for open lesions or macerations.  Vascular: Palpable pedal pulses bilaterally. Capillary refill within normal limits.  Negative for any significant edema or erythema  Neurological: Light touch and protective threshold grossly intact  Musculoskeletal Exam: No pedal deformities noted.   There is some pain on palpation between the third and fourth digit of the right forefoot consistent with a Morton's neuroma.  Hallux valgus deformity also noted which is asymptomatic  Radiographic Exam:  Normal osseous mineralization. Joint spaces preserved. No fracture/dislocation/boney destruction.  Moderate hallux valgus deformity noted  Assessment: 1.  Morton's neuroma third interspace right foot   Plan of Care:  1. Patient evaluated. X-Rays reviewed.  2.  Injection of 0.5 cc Celestone Soluspan injected into the third interspace right foot 3.  Recommend good supportive shoes and sneakers that are wide in the toebox area and allow arch support to offload pressure from the forefoot. 4.  Return      Edrick Kins, DPM [facility]  Dr. Edrick Kins, DPM    [address],  [address]                Office [phone]  Fax [phone]

## 2021-11-28 ENCOUNTER — Other Ambulatory Visit (HOSPITAL_COMMUNITY): Payer: Self-pay

## 2021-12-14 ENCOUNTER — Other Ambulatory Visit (HOSPITAL_COMMUNITY): Payer: Self-pay

## 2021-12-21 ENCOUNTER — Encounter: Payer: Self-pay | Admitting: Podiatry

## 2022-01-11 ENCOUNTER — Ambulatory Visit (INDEPENDENT_AMBULATORY_CARE_PROVIDER_SITE_OTHER): Payer: 59 | Admitting: Podiatry

## 2022-01-11 DIAGNOSIS — G5761 Lesion of plantar nerve, right lower limb: Secondary | ICD-10-CM

## 2022-01-11 NOTE — Progress Notes (Signed)
Patient presents today to be casted for custom molded orthotics. Dr. Logan Bores has been treating patient for ***.   Impression foam cast was taken. ABN signed.  Patient info-  Shoe size: 7 1/2  Shoe style: Athletic  Height: 5'1  Weight: 142  Insurance: UMR   Patient will be notified once orthotics arrive in office and reappoint for fitting at that time.

## 2022-01-13 ENCOUNTER — Other Ambulatory Visit (HOSPITAL_COMMUNITY): Payer: Self-pay

## 2022-02-09 ENCOUNTER — Other Ambulatory Visit (HOSPITAL_COMMUNITY): Payer: Self-pay

## 2022-02-20 ENCOUNTER — Other Ambulatory Visit (HOSPITAL_COMMUNITY): Payer: Self-pay

## 2022-02-20 ENCOUNTER — Other Ambulatory Visit: Payer: Self-pay

## 2022-02-20 ENCOUNTER — Encounter: Payer: Self-pay | Admitting: Podiatry

## 2022-02-21 NOTE — Telephone Encounter (Signed)
appointment

## 2022-02-21 NOTE — Progress Notes (Deleted)
Patient presents today to pick up custom molded foot orthotics recommended by Dr. Amalia Hailey.   Orthotics were dispensed and fit was satisfactory. Reviewed instructions for break-in and wear. Written instructions given to patient.  Patient will follow up as needed.   [facility] - order # ***

## 2022-02-22 ENCOUNTER — Encounter (HOSPITAL_COMMUNITY): Payer: Self-pay

## 2022-02-22 ENCOUNTER — Other Ambulatory Visit (HOSPITAL_COMMUNITY): Payer: Self-pay

## 2022-02-23 ENCOUNTER — Other Ambulatory Visit: Payer: Self-pay

## 2022-02-23 DIAGNOSIS — G5761 Lesion of plantar nerve, right lower limb: Secondary | ICD-10-CM

## 2022-03-02 ENCOUNTER — Other Ambulatory Visit: Payer: 59

## 2022-03-10 ENCOUNTER — Other Ambulatory Visit (HOSPITAL_COMMUNITY): Payer: Self-pay

## 2022-03-10 MED ORDER — ATORVASTATIN CALCIUM 40 MG PO TABS
40.0000 mg | ORAL_TABLET | Freq: Every day | ORAL | 3 refills | Status: DC
  Filled 2022-03-10 – 2022-03-14 (×2): qty 90, 90d supply, fill #0
  Filled 2022-06-23: qty 90, 90d supply, fill #1
  Filled 2022-09-16: qty 90, 90d supply, fill #2
  Filled 2023-01-08: qty 90, 90d supply, fill #3

## 2022-03-14 ENCOUNTER — Other Ambulatory Visit (HOSPITAL_COMMUNITY): Payer: Self-pay

## 2022-03-17 DIAGNOSIS — Z1231 Encounter for screening mammogram for malignant neoplasm of breast: Secondary | ICD-10-CM | POA: Diagnosis not present

## 2022-03-29 ENCOUNTER — Other Ambulatory Visit (HOSPITAL_COMMUNITY): Payer: Self-pay

## 2022-04-19 ENCOUNTER — Other Ambulatory Visit (HOSPITAL_COMMUNITY): Payer: Self-pay

## 2022-04-19 MED ORDER — IBANDRONATE SODIUM 150 MG PO TABS
150.0000 mg | ORAL_TABLET | ORAL | 3 refills | Status: DC
  Filled 2022-04-19: qty 3, 90d supply, fill #0
  Filled 2022-07-10: qty 3, 90d supply, fill #1
  Filled 2022-10-02: qty 3, 90d supply, fill #2
  Filled 2023-01-08: qty 3, 90d supply, fill #3
  Filled 2023-04-09: qty 3, 90d supply, fill #4

## 2022-04-20 ENCOUNTER — Other Ambulatory Visit (HOSPITAL_COMMUNITY): Payer: Self-pay

## 2022-04-20 ENCOUNTER — Other Ambulatory Visit: Payer: Self-pay

## 2022-05-29 ENCOUNTER — Other Ambulatory Visit: Payer: Self-pay

## 2022-05-29 ENCOUNTER — Other Ambulatory Visit (HOSPITAL_COMMUNITY): Payer: Self-pay

## 2022-05-29 MED ORDER — LEVOTHYROXINE SODIUM 25 MCG PO TABS
25.0000 ug | ORAL_TABLET | Freq: Every day | ORAL | 3 refills | Status: DC
  Filled 2022-05-29: qty 90, 90d supply, fill #0
  Filled 2022-08-24: qty 90, 90d supply, fill #1
  Filled 2022-12-04: qty 90, 90d supply, fill #2

## 2022-06-23 ENCOUNTER — Other Ambulatory Visit: Payer: Self-pay

## 2022-07-10 ENCOUNTER — Other Ambulatory Visit (HOSPITAL_COMMUNITY): Payer: Self-pay

## 2022-08-25 ENCOUNTER — Other Ambulatory Visit: Payer: Self-pay

## 2022-09-16 ENCOUNTER — Other Ambulatory Visit (HOSPITAL_COMMUNITY): Payer: Self-pay

## 2022-09-25 DIAGNOSIS — M222X1 Patellofemoral disorders, right knee: Secondary | ICD-10-CM | POA: Diagnosis not present

## 2022-09-25 DIAGNOSIS — Z1211 Encounter for screening for malignant neoplasm of colon: Secondary | ICD-10-CM | POA: Diagnosis not present

## 2022-09-25 DIAGNOSIS — Z0000 Encounter for general adult medical examination without abnormal findings: Secondary | ICD-10-CM | POA: Diagnosis not present

## 2022-09-25 DIAGNOSIS — E782 Mixed hyperlipidemia: Secondary | ICD-10-CM | POA: Diagnosis not present

## 2022-09-25 DIAGNOSIS — Z131 Encounter for screening for diabetes mellitus: Secondary | ICD-10-CM | POA: Diagnosis not present

## 2022-09-25 DIAGNOSIS — E039 Hypothyroidism, unspecified: Secondary | ICD-10-CM | POA: Diagnosis not present

## 2022-09-25 DIAGNOSIS — M222X2 Patellofemoral disorders, left knee: Secondary | ICD-10-CM | POA: Diagnosis not present

## 2022-09-25 DIAGNOSIS — Z124 Encounter for screening for malignant neoplasm of cervix: Secondary | ICD-10-CM | POA: Diagnosis not present

## 2022-09-25 DIAGNOSIS — M810 Age-related osteoporosis without current pathological fracture: Secondary | ICD-10-CM | POA: Diagnosis not present

## 2022-09-25 DIAGNOSIS — Z1231 Encounter for screening mammogram for malignant neoplasm of breast: Secondary | ICD-10-CM | POA: Diagnosis not present

## 2022-10-02 ENCOUNTER — Other Ambulatory Visit (HOSPITAL_COMMUNITY): Payer: Self-pay

## 2022-12-04 ENCOUNTER — Other Ambulatory Visit: Payer: Self-pay

## 2023-01-08 ENCOUNTER — Other Ambulatory Visit: Payer: Self-pay

## 2023-01-08 ENCOUNTER — Other Ambulatory Visit (HOSPITAL_COMMUNITY): Payer: Self-pay

## 2023-03-19 ENCOUNTER — Other Ambulatory Visit (HOSPITAL_COMMUNITY): Payer: Self-pay

## 2023-03-30 DIAGNOSIS — Z1231 Encounter for screening mammogram for malignant neoplasm of breast: Secondary | ICD-10-CM | POA: Diagnosis not present

## 2023-04-09 ENCOUNTER — Other Ambulatory Visit: Payer: Self-pay

## 2023-04-09 ENCOUNTER — Other Ambulatory Visit (HOSPITAL_COMMUNITY): Payer: Self-pay

## 2023-04-09 MED ORDER — ATORVASTATIN CALCIUM 40 MG PO TABS
40.0000 mg | ORAL_TABLET | Freq: Every day | ORAL | 3 refills | Status: AC
  Filled 2023-04-09: qty 90, 90d supply, fill #0
  Filled 2023-07-03: qty 90, 90d supply, fill #1
  Filled 2023-10-01: qty 90, 90d supply, fill #2
  Filled 2024-01-02: qty 90, 90d supply, fill #3

## 2023-04-10 ENCOUNTER — Other Ambulatory Visit: Payer: Self-pay

## 2023-06-13 ENCOUNTER — Other Ambulatory Visit: Payer: Self-pay

## 2023-06-13 ENCOUNTER — Other Ambulatory Visit (HOSPITAL_COMMUNITY): Payer: Self-pay

## 2023-06-13 MED ORDER — LEVOTHYROXINE SODIUM 25 MCG PO TABS
25.0000 ug | ORAL_TABLET | Freq: Every day | ORAL | 3 refills | Status: AC
  Filled 2023-06-13: qty 90, 90d supply, fill #0
  Filled 2023-09-19 (×2): qty 90, 90d supply, fill #1
  Filled 2023-12-24: qty 90, 90d supply, fill #2

## 2023-06-14 DIAGNOSIS — L989 Disorder of the skin and subcutaneous tissue, unspecified: Secondary | ICD-10-CM | POA: Diagnosis not present

## 2023-07-03 ENCOUNTER — Other Ambulatory Visit (HOSPITAL_COMMUNITY): Payer: Self-pay

## 2023-07-09 ENCOUNTER — Other Ambulatory Visit: Payer: Self-pay

## 2023-07-09 ENCOUNTER — Other Ambulatory Visit (HOSPITAL_COMMUNITY): Payer: Self-pay

## 2023-07-09 MED ORDER — IBANDRONATE SODIUM 150 MG PO TABS
150.0000 mg | ORAL_TABLET | ORAL | 3 refills | Status: AC
  Filled 2023-07-09: qty 3, 90d supply, fill #0
  Filled 2023-10-06: qty 3, 90d supply, fill #1
  Filled 2024-01-02: qty 3, 90d supply, fill #2

## 2023-08-21 ENCOUNTER — Encounter: Payer: Self-pay | Admitting: Physician Assistant

## 2023-08-21 ENCOUNTER — Ambulatory Visit (INDEPENDENT_AMBULATORY_CARE_PROVIDER_SITE_OTHER): Admitting: Physician Assistant

## 2023-08-21 VITALS — BP 135/85 | HR 86

## 2023-08-21 DIAGNOSIS — D044 Carcinoma in situ of skin of scalp and neck: Secondary | ICD-10-CM | POA: Diagnosis not present

## 2023-08-21 DIAGNOSIS — L814 Other melanin hyperpigmentation: Secondary | ICD-10-CM | POA: Diagnosis not present

## 2023-08-21 DIAGNOSIS — D492 Neoplasm of unspecified behavior of bone, soft tissue, and skin: Secondary | ICD-10-CM

## 2023-08-21 DIAGNOSIS — L821 Other seborrheic keratosis: Secondary | ICD-10-CM | POA: Diagnosis not present

## 2023-08-21 DIAGNOSIS — D485 Neoplasm of uncertain behavior of skin: Secondary | ICD-10-CM

## 2023-08-21 NOTE — Progress Notes (Signed)
   New Patient Visit   Subjective  Jennifer Pitts is a 68 y.o. female who presents for the following: growth  Pt has a growth on the scalp for over a year. Pcp evaluated it and wasn't concerned. Has bled in the past.  Pt has no hx of skin cancer no family hx of mm.   The following portions of the chart were reviewed this encounter and updated as appropriate: medications, allergies, medical history  Review of Systems:  No other skin or systemic complaints except as noted in HPI or Assessment and Plan.  Objective  Well appearing patient in no apparent distress; mood and affect are within normal limits.  A focused examination was performed of the following areas: Scalp, face, and chest   Relevant exam findings are noted in the Assessment and Plan.        Left Frontal Scalp 0.6 cm hyperkeratotic papule  Assessment & Plan     NEOPLASM OF UNCERTAIN BEHAVIOR OF SKIN Left Frontal Scalp Skin / nail biopsy Type of biopsy: tangential   Informed consent: discussed and consent obtained   Timeout: patient name, date of birth, surgical site, and procedure verified   Procedure prep:  Patient was prepped and draped in usual sterile fashion Prep type:  Isopropyl alcohol Anesthesia: the lesion was anesthetized in a standard fashion   Anesthetic:  1% lidocaine w/ epinephrine 1-100,000 buffered w/ 8.4% NaHCO3 Instrument used: flexible razor blade   Hemostasis achieved with: pressure, aluminum chloride and electrodesiccation   Outcome: patient tolerated procedure well   Post-procedure details: sterile dressing applied and wound care instructions given   Dressing type: bandage and petrolatum    Specimen 1 - Surgical pathology Differential Diagnosis: SCC vs other   Check Margins: No SEBORRHEIC KERATOSIS   LENTIGO    LENTIGO Exam: scattered tan macule of left forehead Due to sun exposure Treatment Plan: Benign-appearing, observe. Recommend daily broad spectrum sunscreen  SPF 30+ to sun-exposed areas, reapply every 2 hours as needed.  Call for any changes  SEBORRHEIC KERATOSIS - Stuck-on, waxy, tan-brown papule of left breast - Benign-appearing - Discussed benign etiology and prognosis. - Observe - Call for any changes    Return in about 6 months (around 02/21/2024) for TBSE.  I, Roseline Hutchinson, CMA, am acting as scribe for Lotus Santillo K, PA-C .   Documentation: I have reviewed the above documentation for accuracy and completeness, and I agree with the above.  Shields Pautz K, PA-C

## 2023-08-21 NOTE — Patient Instructions (Addendum)

## 2023-08-22 LAB — SURGICAL PATHOLOGY

## 2023-08-26 ENCOUNTER — Ambulatory Visit: Payer: Self-pay | Admitting: Physician Assistant

## 2023-08-27 ENCOUNTER — Encounter: Payer: Self-pay | Admitting: Physician Assistant

## 2023-09-19 ENCOUNTER — Other Ambulatory Visit (HOSPITAL_COMMUNITY): Payer: Self-pay

## 2023-09-20 ENCOUNTER — Encounter: Payer: Self-pay | Admitting: Dermatology

## 2023-09-26 ENCOUNTER — Ambulatory Visit: Admitting: Dermatology

## 2023-09-26 ENCOUNTER — Other Ambulatory Visit (HOSPITAL_COMMUNITY): Payer: Self-pay

## 2023-09-26 ENCOUNTER — Encounter: Payer: Self-pay | Admitting: Dermatology

## 2023-09-26 VITALS — BP 190/77 | HR 86 | Temp 98.5°F

## 2023-09-26 DIAGNOSIS — L814 Other melanin hyperpigmentation: Secondary | ICD-10-CM | POA: Diagnosis not present

## 2023-09-26 DIAGNOSIS — C4492 Squamous cell carcinoma of skin, unspecified: Secondary | ICD-10-CM

## 2023-09-26 DIAGNOSIS — C4442 Squamous cell carcinoma of skin of scalp and neck: Secondary | ICD-10-CM | POA: Diagnosis not present

## 2023-09-26 DIAGNOSIS — L578 Other skin changes due to chronic exposure to nonionizing radiation: Secondary | ICD-10-CM | POA: Diagnosis not present

## 2023-09-26 MED ORDER — MUPIROCIN 2 % EX OINT
1.0000 | TOPICAL_OINTMENT | Freq: Two times a day (BID) | CUTANEOUS | 2 refills | Status: AC
  Filled 2023-09-26 (×3): qty 22, 11d supply, fill #0
  Filled 2023-10-06: qty 22, 11d supply, fill #1
  Filled 2023-10-07: qty 22, 11d supply, fill #0
  Filled 2024-01-02: qty 22, 11d supply, fill #1

## 2023-09-26 NOTE — Patient Instructions (Signed)

## 2023-09-26 NOTE — Progress Notes (Signed)
 Follow-Up Visit   Subjective  Jennifer Pitts is a 68 y.o. 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   The following portions of the chart were reviewed this encounter and updated as appropriate: medications, allergies, medical history  Review of Systems:  No other skin or systemic complaints except as noted in HPI or Assessment and Plan.  Objective  Well appearing patient in no apparent distress; mood and affect are within normal limits.  A focused examination was performed of the following areas: scalp Relevant physical exam findings are noted in the Assessment and Plan.   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 No   Was the anticoagulation regimen changed prior to Mohs? No    Anesthesia: Anesthesia method: local infiltration Local anesthetic: lidocaine 1% WITH epi  Procedure Details: Timeout: pre-procedure verification complete Procedure Prep: patient was prepped and draped in usual sterile fashion Prep type: chlorhexidine  Biopsy accession number: [id number] Frozen section biopsy performed: No   Specimen debulked: No   Pre-Op diagnosis: squamous cell carcinoma SCC subtype: in situ MohsAIQ Surgical site (if tumor spans multiple areas, please select predominant area): scalp Surgery side: left Surgical site (from skin exam): left frontal scalp Pre-operative length (cm): 0.4 Pre-operative width (cm): 0.4 Indications for Mohs surgery: anatomic location where tissue conservation is critical Previously treated? No    Micrographic Surgery Details: Post-operative length (cm): 0.8 Post-operative width (cm): 0.8 Number of Mohs stages:  2 Cumulative additional sections past 5 per stage: 0 Post surgery depth of defect: subcutaneous fat Is this a complex case (associate members only): No    Stage 1    Tumor features identified on Mohs section: squamous cell carcinoma    Depth of tumor invasion after stage: subcutaneous fat    Perineural invasion: no perineural invasion  Stage 2    Tumor features identified on Mohs section: no tumor identified    Depth of tumor invasion after stage: subcutaneous fat    Perineural invasion: no perineural invasion  Patient tolerance of procedure: tolerated well, no immediate complications  Reconstruction: Was the defect reconstructed?: No    Opioids: Did the patient receive a prescription for opioid/narcotic related to Mohs surgery?: No    Antibiotics: Does patient meet AHA guidelines for endocarditis?: No   Does patient meet AHA guidelines for orthopedic prophylaxis?: No   Were antibiotics given on the day of surgery? Yes   When were antibiotics given? post-operative Did surgery breach mucosa, expose cartilage/bone, involve an area of lymphedema/inflamed/infected tissue? No   Indication for post-operative antibiotics: anatomic location    Return in about 4 weeks (around 10/24/2023) for wound check.  LILLETTE Berwyn Lesches, Surg Tech III, am acting as scribe for RUFUS CHRISTELLA HOLY, MD.    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 They note that the area has been present for about 6 months increasing in size with time.  There is no history of previous treatment.  Reports no other new or changing lesions and has no other complaints today.  Medications and allergies: see patient chart.  Review of systems:  Reviewed 8 systems and notable for the above skin cancer.  All other systems reviewed are unremarkable/negative, unless noted in the HPI.  Past medical history, surgical history, family history, social history were also reviewed and are noted in the chart/questionnaire.    PHYSICAL EXAMINATION  General: Well-appearing, in no acute distress, alert and oriented x 4. Vitals reviewed in chart (if available).   Skin: Exam reveals a 0.4 x 0.4 cm erythematous papule and biopsy scar on the left frontal scalp. There are rhytids, telangiectasias, and lentigines, consistent with photodamage.  Biopsy report(s) reviewed, confirming the diagnosis.   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 Due to location, size, histology, or recurrence and the likelihood of subclinical extension as well as the need to conserve normal surrounding tissue, the patient was deemed acceptable for Mohs micrographic surgery (MMS).  The nature and purpose of the procedure, associated benefits and risks including recurrence and scarring, possible complications such as pain, infection, and bleeding, and alternative methods of treatment if appropriate were discussed with the patient during consent. The lesion location was verified by the patient, by reviewing previous notes, pathology reports, and by photographs as well as angulation measurements if available.  Informed consent was reviewed and signed by the patient, and timeout was performed at 9:00 AM. See op note below.  2. For the photodamage and solar lentigines, sun protection discussed/information given on OTC sunscreens, and we recommend continued regular follow-up with primary dermatologist every 6 months or sooner for any growing, bleeding, or changing lesions. 3. Prognosis and future surveillance discussed. 4. Letter with treatment outcome sent to referring provider. 5. Pain acetaminophen/ibuprofen  MOHS MICROGRAPHIC SURGERY AND RECONSTRUCTION  Initial size:   0.4 x 0.4 cm Surgical defect/wound size: 0.8 x 0.8 cm Anesthesia:    0.33%  lidocaine with 1:200,000 epinephrine EBL:    <5 mL Complications:  None Repair type:   Second Intention  Stages: 2  STAGE I: Anesthesia achieved with 0.5% lidocaine with 1:200,000 epinephrine. ChloraPrep applied. 1 section(s) excised using Mohs technique (this includes total peripheral and deep tissue margin excision and evaluation with frozen sections, excised and interpreted by the same physician). The tumor was first debulked and then excised with an approx. 2 mm margin.  Hemostasis was achieved with electrocautery as needed.  The specimen was then oriented, subdivided/relaxed, inked, and processed using Mohs technique.    Frozen section analysis revealed a positive margin for full thickness epidermal architectural and cellular atypia, apoptotic cells, individual cell dyskeratosis with markedly altered maturation but usually still some surface keratinization and intercellular bridges present with marked nuclear atypia, including nuclear hyperchromasia and multinucleation in the deep margin.    STAGE II: An additional 2 mm margin was excised.  Hemostasis was achieved with electrocautery as needed.  The specimen was then oriented, subdivided/relaxed, inked, and processed using Mohs technique. Evaluation of slides by the Mohs surgeon revealed clear tumor margins.   Reconstruction  Patient was notified of results and repair options were discussed, including second intention healing. After reviewing the advantages and disadvantages of each, we agreed on second intention healing as appropriate.   The surgical site was then lightly scrubbed with sterile, saline-soaked gauze.  The area was bandaged using Vaseline ointment, non-adherent gauze, gauze pads, and tape to provide an adequate pressure dressing.   The patient tolerated the procedure well, was given detailed written and verbal wound care instructions, and was discharged  in good condition.  The patient will follow-up in 4 weeks and as scheduled  with primary dermatologist.   Documentation: I have reviewed the above documentation for accuracy and completeness, and I agree with the above.  RUFUS CHRISTELLA HOLY, MD

## 2023-09-27 DIAGNOSIS — E782 Mixed hyperlipidemia: Secondary | ICD-10-CM | POA: Diagnosis not present

## 2023-09-27 DIAGNOSIS — M810 Age-related osteoporosis without current pathological fracture: Secondary | ICD-10-CM | POA: Diagnosis not present

## 2023-09-27 DIAGNOSIS — Z1211 Encounter for screening for malignant neoplasm of colon: Secondary | ICD-10-CM | POA: Diagnosis not present

## 2023-09-27 DIAGNOSIS — Z131 Encounter for screening for diabetes mellitus: Secondary | ICD-10-CM | POA: Diagnosis not present

## 2023-09-27 DIAGNOSIS — Z1231 Encounter for screening mammogram for malignant neoplasm of breast: Secondary | ICD-10-CM | POA: Diagnosis not present

## 2023-09-27 DIAGNOSIS — R7301 Impaired fasting glucose: Secondary | ICD-10-CM | POA: Diagnosis not present

## 2023-09-27 DIAGNOSIS — Z124 Encounter for screening for malignant neoplasm of cervix: Secondary | ICD-10-CM | POA: Diagnosis not present

## 2023-09-27 DIAGNOSIS — E039 Hypothyroidism, unspecified: Secondary | ICD-10-CM | POA: Diagnosis not present

## 2023-09-27 DIAGNOSIS — Z0000 Encounter for general adult medical examination without abnormal findings: Secondary | ICD-10-CM | POA: Diagnosis not present

## 2023-10-01 ENCOUNTER — Other Ambulatory Visit: Payer: Self-pay | Admitting: Family Medicine

## 2023-10-01 ENCOUNTER — Other Ambulatory Visit (HOSPITAL_COMMUNITY): Payer: Self-pay

## 2023-10-01 DIAGNOSIS — R7989 Other specified abnormal findings of blood chemistry: Secondary | ICD-10-CM

## 2023-10-04 ENCOUNTER — Other Ambulatory Visit

## 2023-10-06 ENCOUNTER — Other Ambulatory Visit (HOSPITAL_COMMUNITY): Payer: Self-pay

## 2023-10-07 ENCOUNTER — Other Ambulatory Visit: Payer: Self-pay

## 2023-10-11 ENCOUNTER — Encounter: Payer: Self-pay | Admitting: Dermatology

## 2023-10-15 ENCOUNTER — Other Ambulatory Visit

## 2023-10-19 ENCOUNTER — Ambulatory Visit
Admission: RE | Admit: 2023-10-19 | Discharge: 2023-10-19 | Disposition: A | Discharge status: Home / Self Care | Source: Ambulatory Visit | Attending: Family Medicine | Admitting: Family Medicine

## 2023-10-19 DIAGNOSIS — R7989 Other specified abnormal findings of blood chemistry: Secondary | ICD-10-CM

## 2023-10-19 DIAGNOSIS — K8020 Calculus of gallbladder without cholecystitis without obstruction: Secondary | ICD-10-CM | POA: Diagnosis not present

## 2023-10-25 ENCOUNTER — Encounter: Payer: Self-pay | Admitting: Dermatology

## 2023-10-25 ENCOUNTER — Ambulatory Visit: Admitting: Dermatology

## 2023-10-25 VITALS — BP 134/81 | HR 83

## 2023-10-25 DIAGNOSIS — Z48817 Encounter for surgical aftercare following surgery on the skin and subcutaneous tissue: Secondary | ICD-10-CM | POA: Diagnosis not present

## 2023-10-25 DIAGNOSIS — T1490XD Injury, unspecified, subsequent encounter: Secondary | ICD-10-CM

## 2023-10-25 DIAGNOSIS — Z86007 Personal history of in-situ neoplasm of skin: Secondary | ICD-10-CM | POA: Diagnosis not present

## 2023-10-25 NOTE — Patient Instructions (Signed)

## 2023-10-25 NOTE — Progress Notes (Signed)
   Follow Up Visit   Subjective  Jennifer Pitts is a 68 y.o. 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 The patient has been bandaging the wound as directed. The endorse the following concerns: none  The following portions of the chart were reviewed this encounter and updated as appropriate: medications, allergies, medical history  Review of Systems:  No other skin or systemic complaints except as noted in HPI or Assessment and Plan.  Objective  Well appearing patient in no apparent distress; mood and affect are within normal limits.  A focal examination was performed including scalp, head, face and left frontal scalp. All findings within normal limits unless otherwise noted below.  Healing wound with mild erythema  Relevant physical exam findings are noted in the Assessment and Plan.    Assessment & Plan   Healing Wound s/p Mohs for CIS on the scalp, treated on 09/26/23, repaired with 2nd intention - Reassured that wound is healing well - No evidence of infection - No swelling, induration, purulence, dehiscence, or tenderness out of proportion to the clinical exam, see photo above - Discussed that scars take up to 12 months to mature from the date of surgery - Recommend SPF 30+ to scar daily to prevent purple color from UV exposure during scar maturation process - Discussed that erythema and raised appearance of scar will fade over the next 4-6 months - OK to start scar massage at 4-6 weeks post-op - Can consider silicone based products for scar healing starting at 6 weeks post-op - Ok to continue ointment daily to wound under a bandage for another week  HISTORY OF SQUAMOUS CELL CARCINOMA IN SITU OF THE SKIN - No evidence of recurrence today - Recommend regular full body skin exams - Recommend daily broad spectrum  sunscreen SPF 30+ to sun-exposed areas, reapply every 2 hours as needed.  - Call if any new or changing lesions are noted between office visits  Return if symptoms worsen or fail to improve.  I, Darice Smock, CMA, am acting as scribe for RUFUS CHRISTELLA HOLY, MD.   Documentation: I have reviewed the above documentation for accuracy and completeness, and I agree with the above.  RUFUS CHRISTELLA HOLY, MD

## 2023-11-14 ENCOUNTER — Other Ambulatory Visit (HOSPITAL_COMMUNITY): Payer: Self-pay

## 2023-11-14 MED ORDER — FLUZONE HIGH-DOSE 0.5 ML IM SUSY
0.5000 mL | PREFILLED_SYRINGE | Freq: Once | INTRAMUSCULAR | 0 refills | Status: AC
  Filled 2023-11-14: qty 0.5, 1d supply, fill #0

## 2023-11-26 ENCOUNTER — Other Ambulatory Visit (HOSPITAL_COMMUNITY): Payer: Self-pay

## 2023-11-30 ENCOUNTER — Other Ambulatory Visit (HOSPITAL_COMMUNITY): Payer: Self-pay

## 2023-11-30 DIAGNOSIS — M25511 Pain in right shoulder: Secondary | ICD-10-CM | POA: Diagnosis not present

## 2023-11-30 DIAGNOSIS — S46001S Unspecified injury of muscle(s) and tendon(s) of the rotator cuff of right shoulder, sequela: Secondary | ICD-10-CM | POA: Diagnosis not present

## 2023-11-30 MED ORDER — MELOXICAM 15 MG PO TABS
15.0000 mg | ORAL_TABLET | Freq: Every day | ORAL | 0 refills | Status: AC
  Filled 2023-11-30: qty 30, 30d supply, fill #0

## 2023-12-24 ENCOUNTER — Other Ambulatory Visit: Payer: Self-pay

## 2024-01-01 DIAGNOSIS — S46011A Strain of muscle(s) and tendon(s) of the rotator cuff of right shoulder, initial encounter: Secondary | ICD-10-CM | POA: Diagnosis not present

## 2024-01-02 ENCOUNTER — Other Ambulatory Visit (HOSPITAL_COMMUNITY): Payer: Self-pay

## 2024-02-26 ENCOUNTER — Encounter: Payer: Self-pay | Admitting: Physician Assistant

## 2024-02-26 ENCOUNTER — Ambulatory Visit: Admitting: Physician Assistant

## 2024-02-26 VITALS — HR 89

## 2024-02-26 DIAGNOSIS — L578 Other skin changes due to chronic exposure to nonionizing radiation: Secondary | ICD-10-CM | POA: Diagnosis not present

## 2024-02-26 DIAGNOSIS — Z8589 Personal history of malignant neoplasm of other organs and systems: Secondary | ICD-10-CM

## 2024-02-26 DIAGNOSIS — L814 Other melanin hyperpigmentation: Secondary | ICD-10-CM

## 2024-02-26 DIAGNOSIS — D1801 Hemangioma of skin and subcutaneous tissue: Secondary | ICD-10-CM | POA: Diagnosis not present

## 2024-02-26 DIAGNOSIS — D229 Melanocytic nevi, unspecified: Secondary | ICD-10-CM

## 2024-02-26 DIAGNOSIS — L821 Other seborrheic keratosis: Secondary | ICD-10-CM

## 2024-02-26 DIAGNOSIS — Z85828 Personal history of other malignant neoplasm of skin: Secondary | ICD-10-CM | POA: Diagnosis not present

## 2024-02-26 DIAGNOSIS — W908XXA Exposure to other nonionizing radiation, initial encounter: Secondary | ICD-10-CM | POA: Diagnosis not present

## 2024-02-26 DIAGNOSIS — Z1283 Encounter for screening for malignant neoplasm of skin: Secondary | ICD-10-CM

## 2024-02-26 NOTE — Progress Notes (Signed)
" ° °  Total Body Skin Exam (TBSE) Visit   Subjective  Jennifer Pitts is a 69 y.o. female ESTABLISHED PATIENT who presents for the following: Skin Cancer Screening and Full Body Skin Exam  She has undergone Mohs surgery for SCC on left frontal scalp by Dr. Corey on 09/26/2023.    The following portions of the chart were reviewed this encounter and updated as appropriate: medications, allergies, medical history  Review of Systems:  No other skin or systemic complaints except as noted in HPI or Assessment and Plan.  Objective  Well appearing patient in no apparent distress; mood and affect are within normal limits.  A full examination was performed including scalp, head, eyes, ears, nose, lips, neck, chest, axillae, abdomen, back, buttocks, bilateral upper extremities, bilateral lower extremities, hands, feet, fingers, toes, fingernails, and toenails. All findings within normal limits unless otherwise noted below.   Relevant physical exam findings are noted in the Assessment and Plan.    Assessment & Plan   LENTIGINES, SEBORRHEIC KERATOSES, HEMANGIOMAS - Benign normal skin lesions - Benign-appearing - Call for any changes  MELANOCYTIC NEVI - Tan-brown and/or pink-flesh-colored symmetric macules and papules - Benign appearing on exam today - Observation - Call clinic for new or changing moles - Recommend daily use of broad spectrum spf 30+ sunscreen to sun-exposed areas.   ACTINIC DAMAGE - Chronic condition, secondary to cumulative UV/sun exposure - diffuse scaly erythematous macules with underlying dyspigmentation - Recommend daily broad spectrum sunscreen SPF 30+ to sun-exposed areas, reapply every 2 hours as needed.  - Staying in the shade or wearing long sleeves, sun glasses (UVA+UVB protection) and wide brim hats (4-inch brim around the entire circumference of the hat) are also recommended for sun protection.  - Call for new or changing lesions.  SKIN CANCER SCREENING  PERFORMED TODAY.  HISTORY OF SQUAMOUS CELL CARCINOMA OF THE SKIN - No evidence of recurrence today - No lymphadenopathy - Recommend regular full body skin exams - Recommend daily broad spectrum sunscreen SPF 30+ to sun-exposed areas, reapply every 2 hours as needed.  - Call if any new or changing lesions are noted between office visits    ACTINIC SKIN DAMAGE   CHERRY ANGIOMA   LENTIGINES   MULTIPLE BENIGN NEVI   SEBORRHEIC KERATOSIS   SCREENING EXAM FOR SKIN CANCER   HISTORY OF SQUAMOUS CELL CARCINOMA   Return in about 1 year (around 02/25/2025) for TBSE.  I, Doyce Pan, CMA, am acting as scribe for Gelena Klosinski K, PA-C.   Documentation: I have reviewed the above documentation for accuracy and completeness, and I agree with the above.  Kaleel Schmieder K, PA-C   "

## 2024-02-26 NOTE — Patient Instructions (Signed)

## 2025-03-11 ENCOUNTER — Ambulatory Visit: Admitting: Physician Assistant
